# Patient Record
Sex: Female | Born: 1980
Health system: Southern US, Community
[De-identification: ages and names within clinical notes are randomized; demographics above are authoritative.]

## PROBLEM LIST (undated history)

## (undated) DIAGNOSIS — G4733 Obstructive sleep apnea (adult) (pediatric): Secondary | ICD-10-CM

## (undated) DIAGNOSIS — I10 Essential (primary) hypertension: Secondary | ICD-10-CM

## (undated) DIAGNOSIS — E669 Obesity, unspecified: Secondary | ICD-10-CM

## (undated) HISTORY — DX: Obesity, unspecified: E66.9

## (undated) HISTORY — DX: Essential (primary) hypertension: I10

## (undated) HISTORY — PX: ROUX-EN-Y PROCEDURE: SUR1287

---
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
## 2005-03-15 ENCOUNTER — Other Ambulatory Visit: Admission: RE | Admit: 2005-03-15 | Discharge: 2005-03-15 | Payer: Self-pay | Admitting: Obstetrics and Gynecology

## 2006-04-01 ENCOUNTER — Other Ambulatory Visit: Admission: RE | Admit: 2006-04-01 | Discharge: 2006-04-01 | Payer: Self-pay | Admitting: Obstetrics and Gynecology

## 2009-01-10 ENCOUNTER — Ambulatory Visit (HOSPITAL_COMMUNITY): Admission: RE | Admit: 2009-01-10 | Discharge: 2009-01-10 | Payer: Self-pay | Admitting: Surgery

## 2009-01-17 ENCOUNTER — Ambulatory Visit (HOSPITAL_COMMUNITY): Admission: RE | Admit: 2009-01-17 | Discharge: 2009-01-17 | Payer: Self-pay | Admitting: Surgery

## 2009-02-03 ENCOUNTER — Ambulatory Visit (HOSPITAL_BASED_OUTPATIENT_CLINIC_OR_DEPARTMENT_OTHER): Admission: RE | Admit: 2009-02-03 | Discharge: 2009-02-03 | Payer: Self-pay | Admitting: Surgery

## 2009-02-04 ENCOUNTER — Ambulatory Visit: Payer: Self-pay | Admitting: Internal Medicine

## 2009-02-06 ENCOUNTER — Encounter: Admission: RE | Admit: 2009-02-06 | Discharge: 2009-05-07 | Payer: Self-pay | Admitting: Surgery

## 2009-05-22 ENCOUNTER — Inpatient Hospital Stay (HOSPITAL_COMMUNITY): Admission: RE | Admit: 2009-05-22 | Discharge: 2009-05-25 | Payer: Self-pay | Admitting: Surgery

## 2009-05-23 ENCOUNTER — Ambulatory Visit: Payer: Self-pay | Admitting: Surgery

## 2009-05-23 ENCOUNTER — Encounter (INDEPENDENT_AMBULATORY_CARE_PROVIDER_SITE_OTHER): Payer: Self-pay | Admitting: Surgery

## 2009-05-23 IMAGING — CR DG UGI W/ GASTROGRAFIN
2 series · 2 of 2 positions shown · IV contrast (agent unspecified)
Comparison: None

CLINICAL DATA: Morbid obesity, post bypass.

WATER SOLUBLE UPPER GI SERIES
TECHNIQUE: Single-column upper GI series was performed using water
soluble contrast.
Fluoroscopy Time: 2.4 minutes
Contrast: 50 ml [IA].

[view not recorded (1 of 2)]
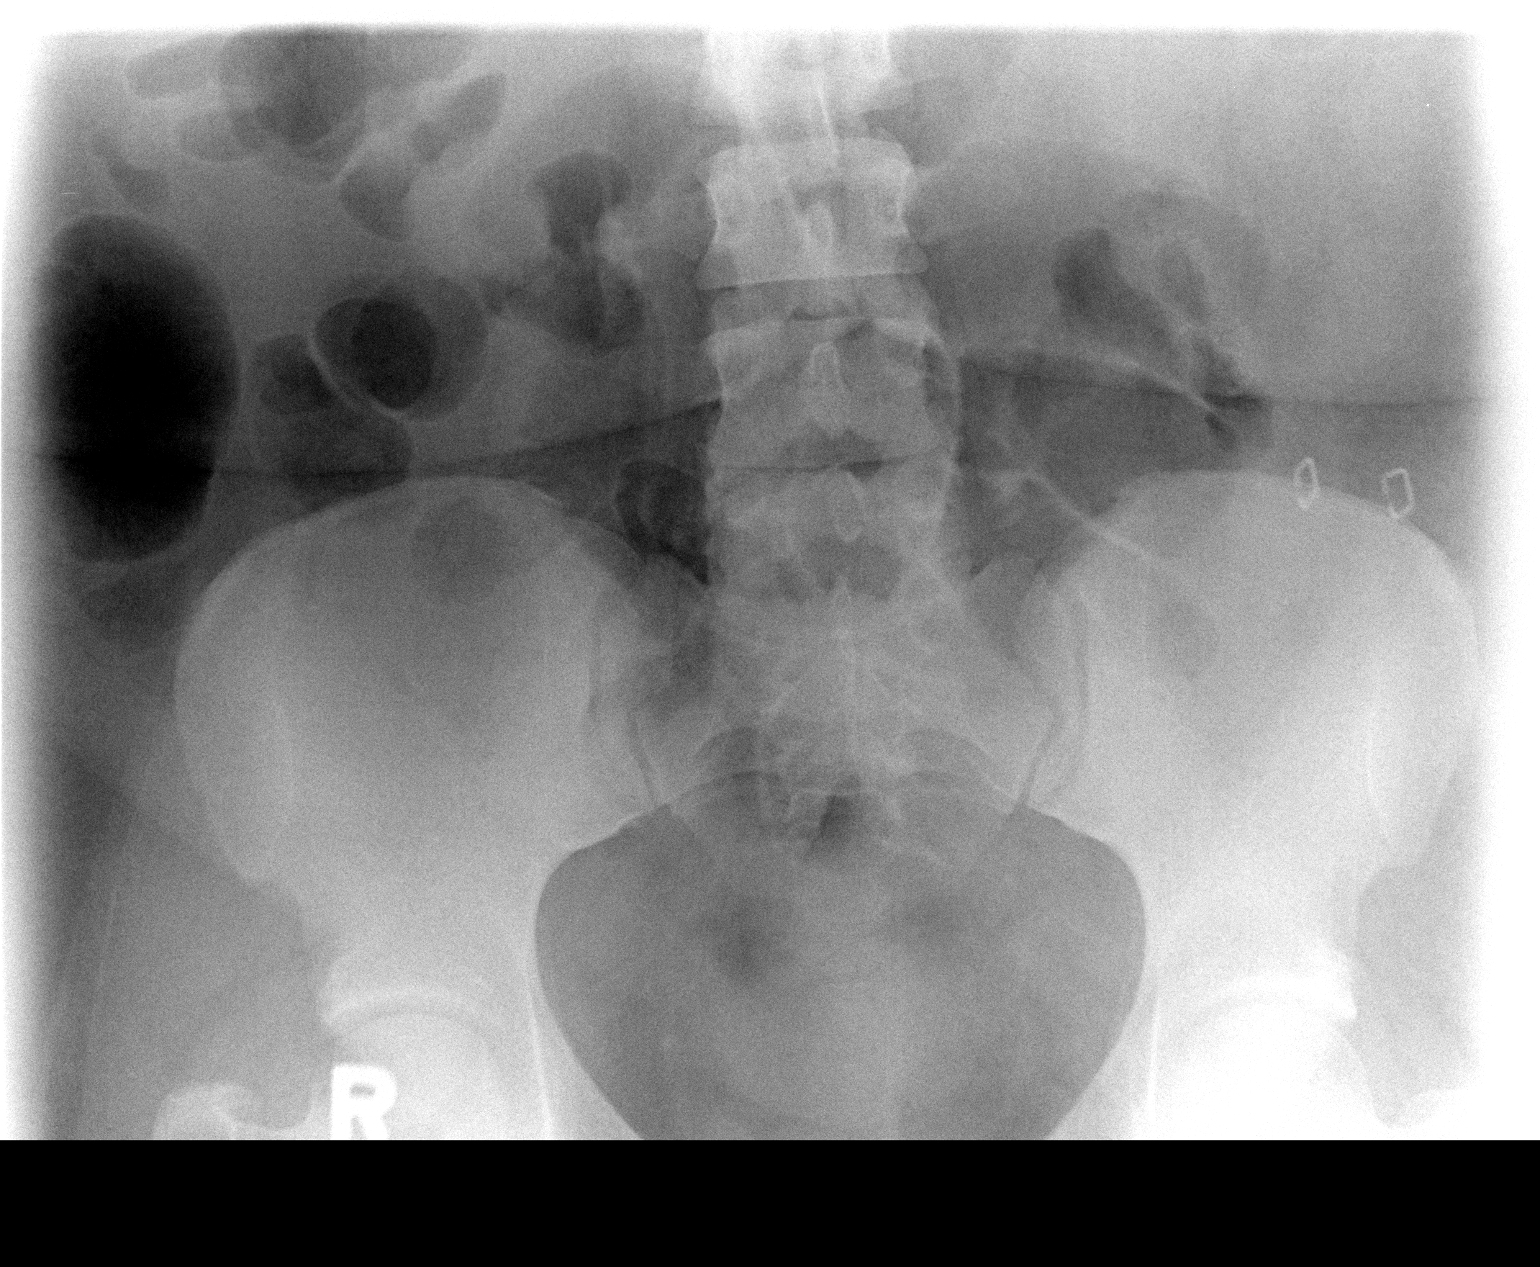

[view not recorded (2 of 2)]
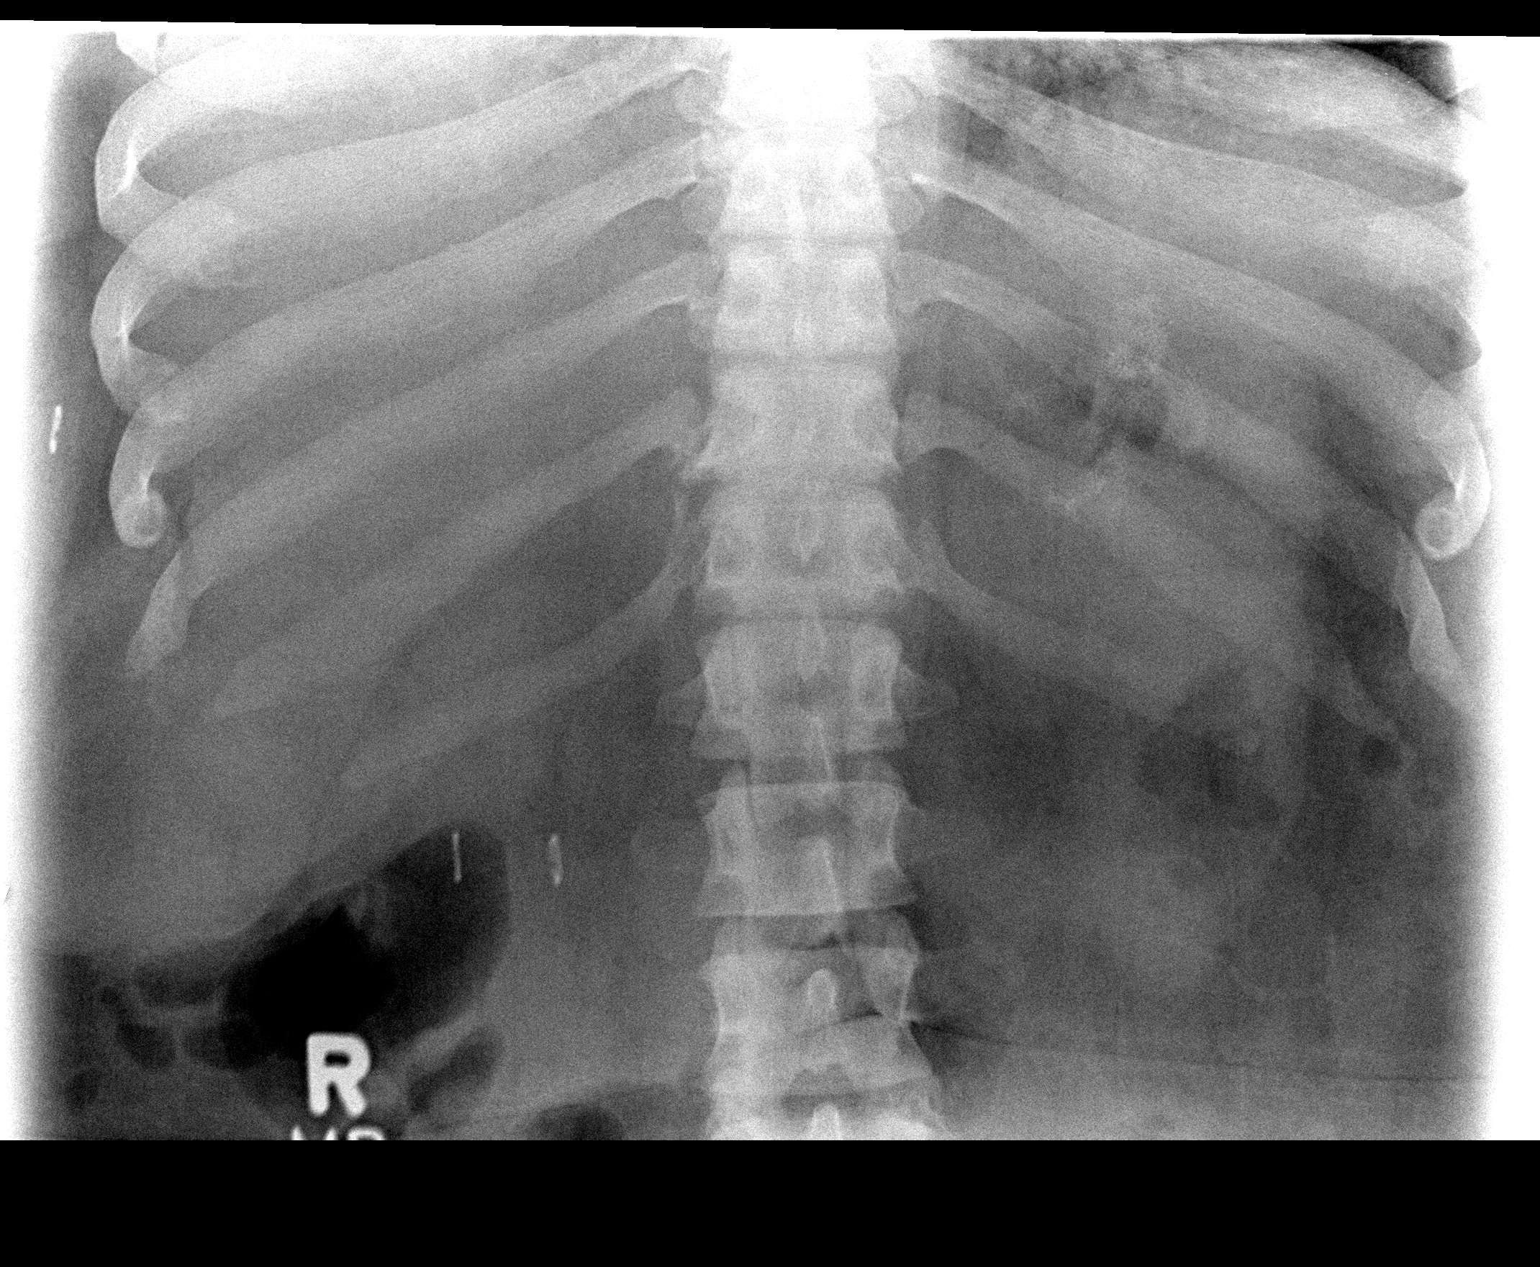

[2 of 2 positions shown; findings below may reference images not displayed]

FINDINGS: Scout film of the abdomen shows a normal bowel gas
pattern.

The patient was able to swallow a 50 ml of [IA] without
difficulty.  Severe gastroesophageal reflux noted throughout the
procedure.  There is slow emptying of the stomach.  Suspect mild
edema at the  gastric - jejunal anastomosis.  Following passage of
contrast into the jejunum, contrast sits in the proximal jejunum
for nearly an hour without passing through the distal anastomosis.
There is complete aperistalsis noted.

Given the very slow passage of contrast, the patient was sent back
upstairs and a portable KUB was done 1 hour later.  This shows
decompression of the stomach and proximal jejunum.  Distal
anastomosis still not readily apparent, but no evidence of
obstruction.
IMPRESSION: Very slow passage of contrast as above, likely related to ileus.
Suspect mild edema at the proximal anastomosis.

Free gastroesophageal reflux.

## 2009-06-05 ENCOUNTER — Encounter: Admission: RE | Admit: 2009-06-05 | Discharge: 2009-07-19 | Payer: Self-pay | Admitting: Surgery

## 2009-07-24 ENCOUNTER — Encounter: Admission: RE | Admit: 2009-07-24 | Discharge: 2009-10-22 | Payer: Self-pay | Admitting: Surgery

## 2009-08-24 ENCOUNTER — Encounter: Admission: RE | Admit: 2009-08-24 | Discharge: 2009-08-24 | Payer: Self-pay | Admitting: Surgery

## 2009-11-22 ENCOUNTER — Encounter: Admission: RE | Admit: 2009-11-22 | Discharge: 2009-11-22 | Payer: Self-pay | Admitting: Surgery

## 2010-05-30 ENCOUNTER — Encounter
Admission: RE | Admit: 2010-05-30 | Discharge: 2010-05-30 | Payer: Self-pay | Source: Home / Self Care | Attending: Surgery | Admitting: Surgery

## 2010-10-24 LAB — HEMOGLOBIN AND HEMATOCRIT, BLOOD
HCT: 35.8 % — ABNORMAL LOW (ref 36.0–46.0)
Hemoglobin: 11.8 g/dL — ABNORMAL LOW (ref 12.0–15.0)

## 2010-10-24 LAB — CBC
HCT: 34.1 % — ABNORMAL LOW (ref 36.0–46.0)
Hemoglobin: 11.3 g/dL — ABNORMAL LOW (ref 12.0–15.0)
Hemoglobin: 11.9 g/dL — ABNORMAL LOW (ref 12.0–15.0)
MCHC: 33.2 g/dL (ref 30.0–36.0)
Platelets: 250 10*3/uL (ref 150–400)
Platelets: 288 10*3/uL (ref 150–400)
RBC: 3.84 MIL/uL — ABNORMAL LOW (ref 3.87–5.11)
RDW: 14.6 % (ref 11.5–15.5)
WBC: 7.4 10*3/uL (ref 4.0–10.5)
WBC: 9 10*3/uL (ref 4.0–10.5)

## 2010-10-25 LAB — COMPREHENSIVE METABOLIC PANEL
AST: 21 U/L (ref 0–37)
Alkaline Phosphatase: 51 U/L (ref 39–117)
CO2: 32 mEq/L (ref 19–32)
Calcium: 9.9 mg/dL (ref 8.4–10.5)
GFR calc Af Amer: >60 mL/min (ref >60)
Glucose, Bld: 108 mg/dL — ABNORMAL HIGH (ref 70–99)
Sodium: 142 mEq/L (ref 135–145)
Total Protein: 9.1 g/dL — ABNORMAL HIGH (ref 6.0–8.3)

## 2010-10-25 LAB — DIFFERENTIAL
Basophils Relative: 0 % (ref 0–1)
Eosinophils Relative: 1 % (ref 0–5)
Lymphs Abs: 1.5 10*3/uL (ref 0.7–4.0)
Monocytes Relative: 11 % (ref 3–12)

## 2010-10-25 LAB — CBC
MCHC: 33.4 g/dL (ref 30.0–36.0)
MCV: 88.4 fL (ref 78.0–100.0)
RBC: 4.68 MIL/uL (ref 3.87–5.11)

## 2010-12-04 NOTE — Procedures (Signed)
NAME:  Andrea Macias, Andrea Macias            ACCOUNT NO.:  [id number]   MEDICAL RECORD NO.:  [id number]          PATIENT TYPE:  OUT   LOCATION:  SLEEP CENTER                 FACILITY:  [facility]   PHYSICIAN:  Clinton D. Maple Hudson, MD, FCCP, FACPDATE OF BIRTH:  August 20, 1979   DATE OF STUDY:  02/03/2009                            NOCTURNAL POLYSOMNOGRAM   REFERRING PHYSICIAN:   REFERRING PHYSICIAN:  Thornton Park. Daphine Deutscher, MD   INDICATIONS FOR STUDY:  Hypersomnia with sleep apnea.   EPWORTH SLEEPINESS SCORE:  Epworth sleepiness score 12/24.  BMI 64.1.  Weight 385 pounds.  Height 65 inches.  Neck 19 inches.   HOME MEDICATION:  Charted and reviewed.   SLEEP ARCHITECTURE:  Split study protocol.  During the diagnostic phase,  total sleep time 122 minutes with sleep efficiency 89.1%.  Stage I was  7.3%, stage II 92.7%, stage III absent, REM absent.  Sleep latency 8.5  minutes, awake after sleep onset 8.5 minutes, arousal index 30.4.  Diphenhydramine 50 mg taken at bedtime.   RESPIRATORY DATA:  Split study protocol.  Apnea/hypopnea index (AHI)  46.2 per hour.  A total of 129 events were scored, all as hypopneas.  Most events were while sleeping none supine.  CPAP was then titrated to  17 CWP, AHI 0 per hour.  She wore a medium full-face ResMed Quattro mask  with heated humidifier.   OXYGEN DATA:  Moderately loud snoring before CPAP with oxygen  desaturation to a nadir of 89%.  After CPAP control mean oxygen  saturation held 96.6% on room air.   CARDIAC DATA:  Normal sinus rhythm.   MOVEMENT/PARASOMNIA:  Insignificant limb movement without sleep  disturbance.  No bathroom trips.   IMPRESSION/RECOMMENDATIONS:  1. Sleep architecture was significant for absence of stages III and      REM.  This was ordered and performed as a nocturnal study.  She      normally works third shift and sleeps during the daytime.  A      daytime sleep study more consistent with the patient's sleep habit      and it can be  requested by prior, made arrangement with a sleep      disorder center when needed.  She uses diphenhydramine to assist      sleep.  2. Moderately severe obstructive sleep apnea/hypopnea syndrome, mainly      hypopneas, AHI of 46.2 per hour with most events while nonsupine.      Moderately loud snoring with oxygen desaturation to a nadir of 89%      on room air.  3. Successful continuous positive airway pressure titration to 17 CWP,      AHI 0 per hour.  She wore a medium ResMed full-face Quattro mask      with heated humidifier.      Clinton D. Maple Hudson, MD, [facility], FACP  Diplomate, Biomedical engineer of Sleep Medicine  Electronically Signed     CDY/MEDQ  D:  02/04/2009 19:45:02  T:  02/05/2009 02:15:16  Job:  [id number]

## 2011-01-28 ENCOUNTER — Encounter (INDEPENDENT_AMBULATORY_CARE_PROVIDER_SITE_OTHER): Payer: Self-pay | Admitting: Surgery

## 2011-01-28 ENCOUNTER — Ambulatory Visit (INDEPENDENT_AMBULATORY_CARE_PROVIDER_SITE_OTHER): Payer: BC Managed Care – PPO | Admitting: Surgery

## 2011-01-28 DIAGNOSIS — Z789 Other specified health status: Secondary | ICD-10-CM

## 2011-01-28 DIAGNOSIS — Z973 Presence of spectacles and contact lenses: Secondary | ICD-10-CM

## 2011-01-28 DIAGNOSIS — R634 Abnormal weight loss: Secondary | ICD-10-CM

## 2011-01-28 NOTE — Progress Notes (Signed)
Andrea Macias comes in today in followup. She is 1-1/2 years out from her Roux-en-Y gastric bypass. She's been having some abdominal pain and constipation issues. She has bowel movement every 3 days using MiraLax. She doesn't report much aware of her loss. She doesn't have any nausea or vomiting. Her weight has gone up to 30 14. When I saw her in November of 2010 her weight was 290.6.  We need to get her on the with the dietitians to see if they can week her diet and get her weight loss back on track. I reviewed her diet with her but could not determine any extra calories. She's not taking and saved in her pain is intermittent and more vague. She will see D. Clovis Riley on August hopefully he will draw her labs. I passed it she requested [place]. I'll see her again in November.

## 2011-01-29 ENCOUNTER — Other Ambulatory Visit (INDEPENDENT_AMBULATORY_CARE_PROVIDER_SITE_OTHER): Payer: Self-pay | Admitting: General Surgery

## 2011-02-22 ENCOUNTER — Encounter: Payer: Self-pay | Admitting: *Deleted

## 2011-02-22 ENCOUNTER — Encounter: Payer: BC Managed Care – PPO | Attending: Surgery | Admitting: *Deleted

## 2011-02-22 VITALS — Ht 65.0 in | Wt 316.4 lb

## 2011-02-22 DIAGNOSIS — Z713 Dietary counseling and surveillance: Secondary | ICD-10-CM | POA: Insufficient documentation

## 2011-02-22 DIAGNOSIS — Z9884 Bariatric surgery status: Secondary | ICD-10-CM | POA: Insufficient documentation

## 2011-02-22 DIAGNOSIS — Z09 Encounter for follow-up examination after completed treatment for conditions other than malignant neoplasm: Secondary | ICD-10-CM | POA: Insufficient documentation

## 2011-02-22 NOTE — Patient Instructions (Addendum)
Goals:  Follow Phase 3B: High Protein + Non-Starchy Vegetables  Eat 3-6 small meals/snacks, every 3-5 hrs  Continue lean protein foods to meet 80g goal  Increase fluid intake to 64oz +  Only consume 1/2 of carbohydrate (fruit, whole grain, starchy, starchy vegetable) with meals  Avoid drinking 15 minutes before, during and 30 minutes after eating  Continue with >30 min of physical activity daily  Decrease protein bar to a bar with <200 calories and <15g "net carb"

## 2011-02-22 NOTE — Progress Notes (Signed)
  Follow-up visit: 20 Months Post-Operative Gastric Bypass Surgery  Medical Nutrition Therapy:  Appt start time: 1000 end time:  1030.  Assessment:  Primary concerns today: post-operative bariatric surgery nutrition management. Pt reports stabbing pain in left mid-abdomen over the past 2 months. Pt has had a history of very good dietary intake and reports that her diet has not changed over the past year yet has experienced a 26# weight gain. She reports that "my food is not digesting". She notes severe constipation without regular Miralax intake. She continues to be concerned about these GI issues.  Weight today: 316.4 lbs Weight change: Gain 26.1 lbs Total weight lost:  88.2 lbs total BMI: 52.6% Weight goal: 240 lbs % Weight goal met: 53%  24-hr recall:  B (3:30 AM): 3oz baked "lean" meat with 1 cup vegetable Snk (5:45  AM): watermelon or cucumber slices OR protein bar (320 calories)  L (8:30 AM): Bacon (2 slices) OR sausage (2 patties) w/ 1 cup grits (w/ cheddar) Snk (8:30 PM): Unjury protein shake with skim milk OR protein bar (320 calories) D (10:30 PM): 3 oz "lean" meat, 1 cup of vegetable (broccoli, green beans)  Fluid intake: water, crystal light, Unjury protein, skim milk (>64oz) Estimated total protein intake: 65-75g protein  Medications: Hctz, Yasmin (BC) Supplementation: 100% compliance with ASMBS supplement regimen  Using straws: No Drinking while eating:No Hair loss: No Carbonated beverages: No N/V/D/C: Constipation, regularly (without Miralax) Dumping Syndrome: None reported  Recent physical activity:  Pt has a "home gym": stability ball, jump rope, elliptical, and cycle. She religiously exercises 5x times/week for 45 minutes  Progress Towards Goal(s):  In progress.   Nutritional Diagnosis:  [place]-3.3 Overweight/obesity As related to s/p Gastric Bypass surgery.  As evidenced by pt continuing to follow Bariatric Surgery Dietary guidelines for continued weight loss.   Intervention:    Follow Phase 3B: High Protein + Non-Starchy Vegetables  Eat 3-6 small meals/snacks, every 3-5 hrs  Continue lean protein foods to meet 80g goal  Increase fluid intake to 64oz +  Only consume 1/2 of carbohydrate (fruit, whole grain, starchy, starchy vegetable) with meals  Avoid drinking 15 minutes before, during and 30 minutes after eating  Continue with >30 min of physical activity daily  Decrease protein bar to a bar with <200 calories and <15g "net carb"  Monitoring/Evaluation:  Dietary intake, exercise, lap band fills, and body weight. Follow up in 2 months for 22 month post-op visit.

## 2011-04-25 ENCOUNTER — Encounter: Payer: Self-pay | Admitting: *Deleted

## 2011-04-25 ENCOUNTER — Encounter: Payer: BC Managed Care – PPO | Attending: Surgery | Admitting: *Deleted

## 2011-04-25 DIAGNOSIS — Z713 Dietary counseling and surveillance: Secondary | ICD-10-CM | POA: Insufficient documentation

## 2011-04-25 DIAGNOSIS — Z9884 Bariatric surgery status: Secondary | ICD-10-CM | POA: Insufficient documentation

## 2011-04-25 DIAGNOSIS — Z09 Encounter for follow-up examination after completed treatment for conditions other than malignant neoplasm: Secondary | ICD-10-CM | POA: Insufficient documentation

## 2011-04-25 NOTE — Patient Instructions (Signed)
Follow Phase 3B: High Protein + Non-Starchy Vegetables  Eat 3-6 small meals/snacks, every 3-5 hrs  Continue lean protein foods to meet 80g goal  Increase fluid intake to 64oz +  Only consume 1/2 of carbohydrate (fruit, whole grain, starchy, starchy vegetable) with meals  Avoid drinking 15 minutes before, during and 30 minutes after eating  Continue with >30 min of physical activity daily  Decrease protein bar to a bar with <200 calories and <15g "net carb"

## 2011-04-25 NOTE — Progress Notes (Signed)
  Follow-up visit: 22 Months Post-Operative Gastric Bypass Surgery  Medical Nutrition Therapy:  Appt start time: 0900 end time:  0930.  Assessment:  Primary concerns today: post-operative bariatric surgery nutrition management. Andrea Macias is very concerned that something is wrong with her pouch. She notes that the stabbing pain that she had 2 months ago has diminished yet she feels like her stomach is "twisting sometimes". She  is still concerned with the amount of weight that she is gaining despite no dietary changes. The only change Andrea Macias can attribute to her potential weight gain is the fact that she sleeps only 4-5 hours/day.  Weight today: 324.8 lbs Weight change: 8.4 lb increase Total weight lost: 79.8 lbs BMI: 54.2% Weight goal: 240 lbs  24-hr recall: Snk (10 PM): Protein shake Snk ( AM): n/a   L (12 AM): 3 oz protein (chicken or fish), 1 cup green beans D (4 AM): 3 oz baked chicken, 1 cup mixed greens  Snk (6 AM): Dannon light and fit yogurt B (9 AM): 1 pc bacon, 1/2 cup grits  Fluid intake: crystal light, water, skim milk, protein drink (unjury) = 64 oz Estimated total protein intake: 80-85g  Medications: No changes; See updated Supplementation: Taking only MVI regularly regularly  Using straws: No Drinking while eating: No Hair loss: No Carbonated beverages: No N/V/D/C: No Dumping syndrome: None reported  Recent physical activity:  Exercises at home-gym doing crunches, sit-ups, elliptical (45 minutes, 4 times/week)  Progress Towards Goal(s):  In progress.  Handouts given during visit include:  Pre-Op Diet   Nutritional Diagnosis:  [place]-3.3 Overweight/obesity As related to recent Gastric Bypass surgery.  As evidenced by pt attempting to follow post-op dietary guidelines for continued weight loss.    Intervention:  Nutrition education.  Monitoring/Evaluation:  Dietary intake, exercise, lap band fills, and body weight. Follow up in 2 months for 24 month post-op  visit.

## 2011-05-30 ENCOUNTER — Ambulatory Visit (INDEPENDENT_AMBULATORY_CARE_PROVIDER_SITE_OTHER): Payer: BC Managed Care – PPO | Admitting: Surgery

## 2011-05-30 ENCOUNTER — Encounter (INDEPENDENT_AMBULATORY_CARE_PROVIDER_SITE_OTHER): Payer: Self-pay | Admitting: Surgery

## 2011-05-30 VITALS — BP 118/82 | HR 80 | Temp 96.2°F | Resp 20 | Ht 65.0 in | Wt 314.5 lb

## 2011-05-30 DIAGNOSIS — Z9884 Bariatric surgery status: Secondary | ICD-10-CM

## 2011-05-30 NOTE — Progress Notes (Signed)
Andrea Macias 30 y.o.  Body mass index is 52.34 kg/(m^2).  Patient Active Problem List  Diagnoses  . Weight decrease  . Wears glasses  . S/P gastric bypass    No Known Allergies  Past Surgical History  Procedure Date  . Roux-en-y procedure    [name], MD No diagnosis found.  She is 24 months postop and is lost 91 pounds. Today's weight is 314.8 and dinner she had a BMI of 67 and her BMI today is down to 52. She is sort of in a plateau and I've encouraged her to continue looking at her diet and increase her exercise. She's working with a Systems analyst. I encouraged her to look at doing enemas because she still has problems with chronic constipation. She is in look at a colonic cleansing person and they begin with her.  I will see her back in 6 months in which case she'll be 2-1/2 years postop. Hopefully she will be detailed below the 300 pound marked that time. Matt B. Daphine Deutscher, MD, [facility] [phone] beeper [phone]  05/30/2011 11:51 AM

## 2011-06-06 ENCOUNTER — Encounter: Payer: Self-pay | Admitting: *Deleted

## 2011-06-06 ENCOUNTER — Encounter: Payer: BC Managed Care – PPO | Attending: Surgery | Admitting: *Deleted

## 2011-06-06 DIAGNOSIS — Z713 Dietary counseling and surveillance: Secondary | ICD-10-CM | POA: Insufficient documentation

## 2011-06-06 DIAGNOSIS — Z9884 Bariatric surgery status: Secondary | ICD-10-CM | POA: Insufficient documentation

## 2011-06-06 DIAGNOSIS — Z09 Encounter for follow-up examination after completed treatment for conditions other than malignant neoplasm: Secondary | ICD-10-CM | POA: Insufficient documentation

## 2011-06-06 NOTE — Progress Notes (Signed)
  Follow-up visit: 2 Years Post-Operative Gastric Bypass Surgery  Medical Nutrition Therapy:  Appt start time: 0831 end time:  0930.  Assessment:  Primary concerns today: post-operative bariatric surgery nutrition management.  Weight today: 317.4 lbs Weight change: 7.4 lbs Total weight lost: 87.2 lbs total BMI: 52.9 Weight goal: 240 lbs  Surgery date: 05/22/09 Start weight at [facility]: 404.6 lbs  24-hr recall:  B (9 PM): Protein shake Snk (AM): N/A   L (3 AM): 2 oz protein, 2 cup salad, 1/2 cup collards Snk (6:30 AM): Protein shake  D (9:30 AM): 2 oz protein, 2 cup salad, 1/2 cup collards Snk (2 PM): Protein shake  Fluid intake: water, protein shake = 64 oz Estimated total protein intake: 60-80g  Medications: Miralax (every other day) Supplementation: Continuing as normal, no reported problems  Using straws: No Drinking while eating: No Hair loss: No Carbonated beverages: No N/V/D/C: Constipation Dumping syndrome: No  Recent physical activity:  "Doing good" = 5 times/week, 60 minutes, Elliptical/Cycyling/Stability ball   Progress Towards Goal(s):  In progress.   Nutritional Diagnosis:  [place]-3.3 Overweight/obesity As related to recent Gastric Bypass surgery.  As evidenced by pt attempting to follow Gastric bypass guidelines for continued weight loss.    Intervention:  Nutrition education.  Monitoring/Evaluation:  Dietary intake, exercise, lap band fills, and body weight. Follow up in 3 months for  post-op visit.

## 2011-06-06 NOTE — Patient Instructions (Addendum)
Goals:  Follow Phase 3B: High Protein + Non-Starchy Vegetables  Pre-Op Diet Pattern  Eat 3-6 small meals/snacks, every 3-5 hrs  Continue lean protein foods to meet 60-80g goal  Continue fluid intake to 64oz +  Avoid drinking 15 minutes before, during and 30 minutes after eating  Aim for >30 min of physical activity daily

## 2011-07-30 ENCOUNTER — Ambulatory Visit: Payer: BC Managed Care – PPO | Admitting: *Deleted

## 2011-08-13 ENCOUNTER — Encounter: Payer: BC Managed Care – PPO | Attending: Surgery | Admitting: *Deleted

## 2011-08-13 ENCOUNTER — Encounter: Payer: Self-pay | Admitting: *Deleted

## 2011-08-13 DIAGNOSIS — Z9884 Bariatric surgery status: Secondary | ICD-10-CM | POA: Insufficient documentation

## 2011-08-13 DIAGNOSIS — Z713 Dietary counseling and surveillance: Secondary | ICD-10-CM | POA: Insufficient documentation

## 2011-08-13 DIAGNOSIS — Z09 Encounter for follow-up examination after completed treatment for conditions other than malignant neoplasm: Secondary | ICD-10-CM | POA: Insufficient documentation

## 2011-08-13 NOTE — Patient Instructions (Signed)
Goals:  Follow Phase 3B: High Protein + Non-Starchy Vegetables  Pre-Op Diet Pattern  Eat 3-6 small meals/snacks, every 3-5 hrs  Continue lean protein foods to meet 60-80g goal  Continue fluid intake to 64oz +  Avoid drinking 15 minutes before, during and 30 minutes after eating  Aim for >30 min of physical activity daily 

## 2011-08-13 NOTE — Progress Notes (Signed)
  Follow-up visit: 2 Years Post-Operative Gastric Bypass Surgery  Medical Nutrition Therapy:  Appt start time: 0830 end time:  0900.  Assessment:  Primary concerns today: post-operative bariatric surgery nutrition management. Andrea Macias requested the use of the Tanita scale to measure body composition. Will recheck her stats with Tanita at next visit.  Surgery date: 05/22/09  Start weight at [facility]: 404.6 lbs  Weight today: 320.1 lbs (318 lbs per Tanita scale) Weight change: 2.7 lbs gain Total weight lost: 84.5 lbs  BMI: 54.3% Weight goal: 240 lbs  24-hr recall:  B (9 PM): Protein shake  Snk (AM): N/A  L (3 AM): 3 oz protein, 2 cup salad (light dressing), 1/2 cup collards  Snk (6:30 AM): Protein shake  D (9:30 AM):3 oz protein, 2 cup salad (light dressing), 1/2 cup collards  Snk (2 PM): Protein shake  Fluid intake: water, protein shake = 64 oz  Estimated total protein intake: 60-80g  Medications: Miralax (every other day)  Supplementation: Continuing as normal, no reported problems  Using straws: No  Drinking while eating: No  Hair loss: No  Carbonated beverages: No  N/V/D/C: Constipation resolved  Dumping syndrome: No  Recent physical activity: "Doing good" = 5 times/week, 60 minutes, Elliptical/Cycyling/Stability ball   Progress Towards Goal(s): In progress.   Nutritional Diagnosis:  Andrea Macias-3.3 Overweight/obesity As related to recent Gastric Bypass surgery. As evidenced by pt attempting to follow Gastric bypass guidelines for continued weight loss.  Intervention: Nutrition education.   Monitoring/Evaluation: Dietary intake, exercise, and body weight. Follow up in 3 months for post-op visit.

## 2011-09-17 ENCOUNTER — Ambulatory Visit: Payer: BC Managed Care – PPO | Admitting: *Deleted

## 2011-09-20 ENCOUNTER — Encounter: Payer: Self-pay | Admitting: *Deleted

## 2011-09-20 ENCOUNTER — Encounter: Payer: BC Managed Care – PPO | Attending: Surgery | Admitting: *Deleted

## 2011-09-20 DIAGNOSIS — Z713 Dietary counseling and surveillance: Secondary | ICD-10-CM | POA: Insufficient documentation

## 2011-09-20 DIAGNOSIS — Z09 Encounter for follow-up examination after completed treatment for conditions other than malignant neoplasm: Secondary | ICD-10-CM | POA: Insufficient documentation

## 2011-09-20 DIAGNOSIS — Z9884 Bariatric surgery status: Secondary | ICD-10-CM | POA: Insufficient documentation

## 2011-09-20 NOTE — Progress Notes (Addendum)
Follow-up visit: 26 Months Post-Operative Gastric Bypass Surgery  Medical Nutrition Therapy:  Appt start time: 0830 end time:  0900.  Assessment:  Primary concerns today: post-operative bariatric surgery nutrition management.  Andrea Macias returns for follow up body composition analysis on the TANITA. Continues to struggle with weight loss.   TANITA  BODY COMP RESULTS  09/20/11 08/13/11  %Fat 53.8% 53.3%  FM (lbs) 172.0 169.5  FFM (lbs) 147.0 148.5  TBW (lbs) 108.0 108.5    Surgery date: 05/22/09  Start weight at [facility]: 404.6 lbs  Weight today: 321.9 lbs Weight change: 1.8 lb gain Total weight lost: 82.7 lbs  BMI: 53.5 Weight goal: 240 lbs  24-hr recall:  B (9:45 PM): Protein shake  Snk (10 PM): N/A  L (1 AM): 3 oz protein, 2 cup salad (light dressing), 1/2 cup greens  Snk (4:00 AM): Protein shake  D (7:30 AM): 3 oz protein, 2 cup salad (light dressing), 1/2 cup greens  Snk (10 AM): Protein shake  Fluid intake: Crystal light, water, protein shake = 64oz + Estimated total protein intake: 60-80g  Medications: Miralax (every other day)  Supplementation: Continuing as normal, no reported problems  Using straws: No  Drinking while eating: No  Hair loss: No  Carbonated beverages: No  N/V/D/C: Constipation resolved with meds Dumping syndrome: No  Recent physical activity: 4-5 times/week, 45 minutes, Elliptical/Cycling/Stability ball   Progress Towards Goal(s): In progress.   Nutritional Diagnosis:  [place]-3.3 Overweight/obesity related to recent Gastric Bypass surgery as evidenced by pt attempting to follow Gastric bypass guidelines for continued weight loss.  Intervention: Nutrition education.   Monitoring/Evaluation: Dietary intake, exercise, and body weight. Follow up in 3 months for post-op visit.

## 2011-09-21 ENCOUNTER — Encounter: Payer: Self-pay | Admitting: *Deleted

## 2011-09-21 NOTE — Patient Instructions (Signed)
Goals:  Follow Phase 3B: High Protein + Non-Starchy Vegetables  Pre-Op Diet Pattern  Eat 3-6 small meals/snacks, every 3-5 hrs  Continue lean protein foods to meet 60-80g goal  Continue fluid intake to 64oz +  Avoid drinking 15 minutes before, during and 30 minutes after eating  Aim for >30 min of physical activity daily 

## 2011-11-05 ENCOUNTER — Encounter: Payer: Self-pay | Admitting: Obstetrics and Gynecology

## 2011-11-05 ENCOUNTER — Ambulatory Visit (INDEPENDENT_AMBULATORY_CARE_PROVIDER_SITE_OTHER): Payer: BC Managed Care – PPO | Admitting: Obstetrics and Gynecology

## 2011-11-05 VITALS — BP 120/78 | Ht 65.0 in | Wt 330.0 lb

## 2011-11-05 DIAGNOSIS — R87610 Atypical squamous cells of undetermined significance on cytologic smear of cervix (ASC-US): Secondary | ICD-10-CM

## 2011-11-05 NOTE — Progress Notes (Addendum)
Last Pap 8/12 Ascus Negative Hpv Colpo 10/12 bx sig for chronic cervicitis REPEAT PAP VISIT  Subjective:    Andrea Macias is a 31 y.o. female who presents for a  repeat pap   History of previous cervical treatment:  no treatment  Last pap showed:  ASCUS with NEGATIVE high risk HPV  Date 8//2012  High Risk HPV present: no  Colposcopy results:   HPV related changes and in chronic cervicitis   Date:   10//2012  Gardasil received: no  Offered:  no Tobacco use:  No  Objective:   Pelvic:  Abdomen :  Soft obese NT External genitalia: normal Perianal skin: no external genital warts noted Vagina: normal without discharge Cervix: normal in appearance Uterus mobile nt no adnexal masses palpated  Assessment and Plan:   Abnormal pap or biopsy demonstrating ASCUS bx with chronic cervicitis  Pap # 1 performed Return to the office in 6 months for repeat Pap until 3 consecutive normal Pap Smear Continue Folic Acid 1 mg daily  DILLARD,NAIMA AMD 4/16/20139:01 AM

## 2011-11-05 NOTE — Patient Instructions (Signed)
Abnormal Pap Test WHAT DOES A PAP TEST CHECK FOR? A Pap test checks the cells in the cervix for any infections or changes that may turn into cancer. The cells are checked to see if they look normal or if they show any changes (abnormal). Abnormal changes may be a sign of problems with your cervix. Cervical cells that are abnormal are called cervical dysplasia. Dysplasia is not cancer. It is a pre-cancerous change in the cells of your cervix. WHAT DOES AN ABNORMAL PAP TEST MEAN? Most times, an abnormal Pap test does not mean you have cancer. However, it does show that there may be a problem. Your doctor will want to do other tests to find out more about the abnormal cells.  Your abnormal Pap test results could show:  Small changes that should be carefully watched.   Dysplasia that could grow into cancer.   Cancer.  When dysplasia is found and treated early, it most often does not grow into cancer. WHAT WILL BE DONE ABOUT MY ABNORMAL PAP TEST? You may have:  A colposcopy test done. Your cevix will be looked at using a strong light and a microscope.   A cone biopsy. A small, cone-shaped sample of your cervix is taken out. The part that is taken out is the area where the abnormal cells are.   Cryosurgery. The abnormal cells on your cervix will be frozen.   Loop Electrical Excision Procedure (LEEP). The abnormal cells will be taken out.  WHAT IF I HAVE A DYSPLASIA OR A CANCER? You and your doctor may choose a hysterectomy as the best treatment for you. During this surgery, your womb (uterus) and your cervix will be taken out. WHAT SHOULD YOU DO AFTER BEING TREATED? Keep having Pap tests and checkups as often as your doctor tells you. Your cervical problem will be carefully watched so it does not get worse. Also, your doctor can watch for, and treat, any new problems that may come up. Document Released: 10/23/2010 Document Revised: 06/27/2011 Document Reviewed: 06/09/2011 Exit[employer] Patient  Information 2012 [employer]. 

## 2011-11-07 LAB — PAP IG W/ RFLX HPV ASCU

## 2011-12-20 ENCOUNTER — Telehealth (INDEPENDENT_AMBULATORY_CARE_PROVIDER_SITE_OTHER): Payer: Self-pay | Admitting: General Surgery

## 2011-12-20 NOTE — Telephone Encounter (Signed)
Called patient back per message left. Patient indicated she has been set up with an appointment for 01/02/12, but wanted to know if tests could be ordered. I advised that tests would not be ordered until the day of visit. I advised that he would order tests based on evaluation and would not want to subject her to testing she may not need to determine cause of pain in her abdomen. Patient agreed.

## 2011-12-27 ENCOUNTER — Ambulatory Visit: Payer: BC Managed Care – PPO | Admitting: *Deleted

## 2012-01-02 ENCOUNTER — Encounter (INDEPENDENT_AMBULATORY_CARE_PROVIDER_SITE_OTHER): Payer: Self-pay | Admitting: Surgery

## 2012-01-02 ENCOUNTER — Ambulatory Visit (INDEPENDENT_AMBULATORY_CARE_PROVIDER_SITE_OTHER): Payer: BC Managed Care – PPO | Admitting: Surgery

## 2012-01-02 VITALS — BP 122/78 | HR 82 | Temp 96.4°F | Resp 18 | Ht 65.0 in | Wt 328.2 lb

## 2012-01-02 DIAGNOSIS — Z9884 Bariatric surgery status: Secondary | ICD-10-CM

## 2012-01-02 NOTE — Progress Notes (Signed)
Andrea Macias 31 y.o.  Body mass index is 54.62 kg/(m^2).  Patient Active Problem List  Diagnosis  . Weight decrease  . Wears glasses  . S/P gastric bypass  . ASCUS on Pap smear    Allergies  Allergen Reactions  . Penicillins Itching    Past Surgical History  Procedure Date  . Roux-en-y procedure    Andrea Stabile, MD No diagnosis found.  Frustrated by 40 lbs weight gain.  Has added some carbs which I warned her about.  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  Advised to try to lie down at least 7-8 hours a day.   Will see back in 2 months.  Today's weight is 328.4.  Andrea B. Daphine Deutscher, MD, [facility] [phone] beeper [phone]  01/02/2012 9:50 AM

## 2012-01-02 NOTE — Patient Instructions (Addendum)
Avoid carbs in diet.

## 2012-02-03 ENCOUNTER — Ambulatory Visit: Payer: BC Managed Care – PPO | Admitting: *Deleted

## 2012-02-11 ENCOUNTER — Ambulatory Visit: Payer: BC Managed Care – PPO | Admitting: *Deleted

## 2012-03-13 ENCOUNTER — Encounter: Payer: BC Managed Care – PPO | Attending: Surgery | Admitting: *Deleted

## 2012-03-13 DIAGNOSIS — Z9884 Bariatric surgery status: Secondary | ICD-10-CM | POA: Insufficient documentation

## 2012-03-13 DIAGNOSIS — Z713 Dietary counseling and surveillance: Secondary | ICD-10-CM | POA: Insufficient documentation

## 2012-03-13 DIAGNOSIS — Z09 Encounter for follow-up examination after completed treatment for conditions other than malignant neoplasm: Secondary | ICD-10-CM | POA: Insufficient documentation

## 2012-03-13 NOTE — Progress Notes (Signed)
Follow-up visit:  2 Yrs, 9 Months Post-Operative Gastric Bypass Surgery  Medical Nutrition Therapy:  Appt start time: 0830 end time:  0900.  Primary concerns today: Post-operative bariatric surgery nutrition management.  Andrea Macias returns for follow up with additional wt gain of 8 lbs.  Likely not eating enough calories for exercise level. Discussed adding more to facilitate wt loss.   TANITA  BODY COMP RESULTS  09/20/11 08/13/11 03/13/12  %Fat 53.8% 53.3% 54.0%  FM (lbs) 172.0 169.5 178.0  FFM (lbs) 147.0 148.5 152.0  TBW (lbs) 108.0 108.5 111.5   Surgery date: 05/22/09  Start weight at [facility]: 404.6 lbs  Weight today: 330.0 lbs Weight change: 8.1 lb gain Total weight lost: 74.6 lbs  BMI: 54.9 kg/m^2  Weight goal: 240 lbs % goal met: 45%  24-hr recall:  B (10 PM): Protein shake (20g) Snk (PM): N/A  L (2 AM): 3 oz protein, 2 cup salad (light dressing), 1/2 cup greens  Snk (5:00 AM): Protein shake  *WORKS OUT AT 8:30 AM*  Snk (9:45 AM): Protein shake after workout D (2:30 PM): 3 oz protein, 2 cup salad (light dressing), 1/2 cup greens   Fluid intake: Crystal light, water, protein shake = 64oz + Estimated total protein intake: 60-80g  Medications: Miralax (every other day)  Supplementation: Continuing as normal, no reported problems  Using straws: No  Drinking while eating: No  Hair loss: No  Carbonated beverages: No  N/V/D/C: Constipation resolved with meds Dumping syndrome: No  Recent physical activity: 4-5 times/week, 45-50 minutes, Elliptical/Cycling/Stability ball   Progress Towards Goal(s): In progress.   Nutritional Diagnosis:  [place]-3.3 Overweight/obesity related to recent Gastric Bypass surgery as evidenced by pt attempting to follow Gastric bypass guidelines for continued weight loss.  Intervention: Nutrition education.   Monitoring/Evaluation: Dietary intake, exercise, and body weight. Follow up in 3 months for post-op visit.

## 2012-03-13 NOTE — Patient Instructions (Addendum)
Goals:  Eat 3-6 small meals/snacks, every 3-5 hrs  Increase lean protein foods to meet 60-80g goal  Increase fluid intake to 64oz +  Add 15 grams of carbohydrate (fruit, whole grain, starchy vegetable) with meals  Avoid drinking 15 minutes before, during and 30 minutes after eating  Aim for >30 min of physical activity daily

## 2012-03-15 ENCOUNTER — Encounter: Payer: Self-pay | Admitting: *Deleted

## 2012-03-16 ENCOUNTER — Encounter: Payer: Self-pay | Admitting: *Deleted

## 2012-04-02 ENCOUNTER — Encounter (INDEPENDENT_AMBULATORY_CARE_PROVIDER_SITE_OTHER): Payer: Self-pay | Admitting: Surgery

## 2012-04-02 ENCOUNTER — Ambulatory Visit (INDEPENDENT_AMBULATORY_CARE_PROVIDER_SITE_OTHER): Payer: BC Managed Care – PPO | Admitting: Surgery

## 2012-04-02 VITALS — BP 124/84 | HR 80 | Temp 96.8°F | Resp 18 | Ht 65.0 in | Wt 337.8 lb

## 2012-04-02 DIAGNOSIS — Z9884 Bariatric surgery status: Secondary | ICD-10-CM

## 2012-04-02 NOTE — Progress Notes (Signed)
Andrea Macias 31 y.o.  Body mass index is 56.21 kg/(m^2).  Patient Active Problem List  Diagnosis  . Weight decrease  . Wears glasses  . S/P gastric bypass  . ASCUS on Pap smear    Allergies  Allergen Reactions  . Penicillins Itching    Past Surgical History  Procedure Date  . Roux-en-y procedure    [name], MD No diagnosis found.  Since she added some carbs to her diet, she has gained 10 lbs.  She is working with a Psychologist, educational.  I have given her information about the BELT program.  Have asked her to try to reduce any carbs in her diet (no toast or carrots, etc) and see me back in 3 months.     Matt B. Daphine Deutscher, MD, [facility] [phone] beeper [phone]  04/02/2012 9:30 AM

## 2012-04-02 NOTE — Patient Instructions (Signed)
Try to stay on the low carb diet Explore the BELT program to increase exercise.  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

## 2012-05-18 ENCOUNTER — Encounter: Payer: Self-pay | Admitting: Obstetrics and Gynecology

## 2012-05-18 ENCOUNTER — Ambulatory Visit (INDEPENDENT_AMBULATORY_CARE_PROVIDER_SITE_OTHER): Payer: BC Managed Care – PPO | Admitting: Obstetrics and Gynecology

## 2012-05-18 VITALS — BP 102/76 | Wt 343.0 lb

## 2012-05-18 DIAGNOSIS — R6889 Other general symptoms and signs: Secondary | ICD-10-CM

## 2012-05-18 MED ORDER — DROSPIRENONE-ETHINYL ESTRADIOL 3-0.03 MG PO TABS: 1.0000 | ORAL_TABLET | Freq: Every day | ORAL | Status: DC

## 2012-05-18 NOTE — Addendum Note (Signed)
Addended by: Rolla Plate on: 05/18/2012 01:37 PM   Modules accepted: Orders

## 2012-05-18 NOTE — Patient Instructions (Signed)
Abnormal Pap Test Information  During a Pap test, the cells on the surface of your cervix are checked to see if they look normal, abnormal, or if they show signs of having been altered by a certain type of virus called human papillomavirus, or HPV. Cervical cells that have been affected by HPV are called dysplasia. Dysplasia is not cancer, but describes abnormal cells found on the surface of the cervix. Depending on the degree of dysplasia, some of the cells may be considered pre-cancerous and may turn into cancer over time if follow up with a caregiver is delayed.   WHAT DOES AN ABNORMAL PAP TEST MEAN?  Having an abnormal pap test does not mean that you have cancer. However, certain types of abnormal pap tests can be a sign that a person is at a higher risk of developing cancer. Your caregiver will want to do other tests to find out more about the abnormal cells. Your abnormal Pap test results could show:   · Small and uncertain changes that should be carefully watched.    · Cervical dysplasia that has caused mild changes and can be followed over time.    · Cervical dysplasia that is more severe and needs to be followed and treated to ensure the problem goes away.  · Cancer.    When severe cervical dysplasia is found and treated early, it rarely will grow into cancer.   WHAT WILL BE DONE ABOUT MY ABNORMAL PAP TEST?  · A colposcopy may be needed. This is a procedure where your cervix is examined using light and magnification.  · A small tissue sample of your cervix (biopsy) may need to be removed and then examined. This is often performed if there are areas that appear infected.  · A sample of cells from the cervical canal may be removed with either a small brush or scraping instrument (curette).  Based on the results of the procedures above, some caregivers may recommend either cryotherapy of the cervix or a surgical LEEP where a portion of the cervix is removed. LEEP is short for "loop electrical excisional  procedure." Rarely, a caregiver may recommend a cone biopsy. This is a procedure where a small, cone-shaped sample of your cervix is taken out. The part that is taken out is the area where the abnormal cells are.    WHAT IF I HAVE A DYSPLASIA OR A CANCER?  You may be referred to a specialist. Radiation may also be a treatment for more advanced cancer. Having a hysterectomy is the last treatment option for dysplasia, but it is a more common treatment for someone with cancer. All treatment options will be discussed with you by your caregiver.  WHAT SHOULD YOU DO AFTER BEING TREATED?  If you have had an abnormal pap test, you should continue to have regular pap tests and check-ups as directed by your caregiver. Your cervical problem will be carefully watched so it does not get worse. Also, your caregiver can watch for, and treat, any new problems that may come up.  Document Released: 10/23/2010 Document Revised: 09/30/2011 Document Reviewed: 07/04/2011  ExitCare® Patient Information ©2013 [employer].

## 2012-05-18 NOTE — Progress Notes (Signed)
Last Pap Normal: yes Date: 11/05/11 Grade:  High Risk HPV: no Vaginal Discharge:no Prior LEEP:no Prior Conization:no Prior Cryotherapy:no Prior Lazer:no Pt with h/o ASCUS and LGSIL BP 102/76  Wt 343 lb (155.584 kg)  LMP 05/09/2012 Physical Examination: General appearance - alert, well appearing, and in no distress Abdomen - soft, nontender, nondistended, no masses or organomegaly Pelvic - normal external genitalia, vulva, vagina, cervix, uterus and adnexa #2 pap done if normal pt needs pap in one yr

## 2012-05-20 LAB — PAP IG W/ RFLX HPV ASCU

## 2012-05-27 ENCOUNTER — Telehealth (INDEPENDENT_AMBULATORY_CARE_PROVIDER_SITE_OTHER): Payer: Self-pay

## 2012-05-27 NOTE — Telephone Encounter (Signed)
Pt called re: letter for BELT program.  Pt advised per [place] Payment, letter has been signed by Dr Daphine Deutscher and faxed to [facility] last week. Hard copy sent to be scanned.

## 2012-08-04 ENCOUNTER — Other Ambulatory Visit: Payer: Self-pay | Admitting: Obstetrics and Gynecology

## 2012-08-04 DIAGNOSIS — IMO0001 Reserved for inherently not codable concepts without codable children: Secondary | ICD-10-CM

## 2012-08-04 MED ORDER — DROSPIRENONE-ETHINYL ESTRADIOL 3-0.03 MG PO TABS: 1.0000 | ORAL_TABLET | Freq: Every day | ORAL | Status: DC

## 2012-08-05 NOTE — Telephone Encounter (Signed)
Approved rx refill and sent to the pharmacy.

## 2012-08-06 ENCOUNTER — Ambulatory Visit (INDEPENDENT_AMBULATORY_CARE_PROVIDER_SITE_OTHER): Payer: BC Managed Care – PPO | Admitting: Surgery

## 2012-10-08 ENCOUNTER — Encounter (INDEPENDENT_AMBULATORY_CARE_PROVIDER_SITE_OTHER): Payer: Self-pay | Admitting: Surgery

## 2012-10-08 ENCOUNTER — Ambulatory Visit (INDEPENDENT_AMBULATORY_CARE_PROVIDER_SITE_OTHER): Payer: BC Managed Care – PPO | Admitting: Surgery

## 2012-10-08 VITALS — BP 130/82 | HR 77 | Temp 98.6°F | Resp 14 | Ht 65.0 in | Wt 333.0 lb

## 2012-10-08 DIAGNOSIS — Z9884 Bariatric surgery status: Secondary | ICD-10-CM

## 2012-10-08 NOTE — Patient Instructions (Signed)
Thanks for your patience.  If you need further assistance after leaving the office, please call our office and speak with a [facility] nurse.  [phone].  If you want to leave a message for Dr. Codi Folkerts, please call his office phone at [phone]. 

## 2012-10-08 NOTE — Progress Notes (Signed)
Andrea Macias 32 y.o.  Body mass index is 55.41 kg/(m^2).  Patient Active Problem List  Diagnosis  . Weight decrease  . Wears glasses  . Lap Roux Y Gastric Bypass Nov 2010  . ASCUS on Pap smear    Allergies  Allergen Reactions  . Penicillins Itching    Past Surgical History  Procedure Laterality Date  . Roux-en-y procedure     Andrea Stabile, MD No diagnosis found.  Andrea Macias comes in with a 52 lb weight loss.  Having sleeping issues that seem to be related to her nocturnal work and not the use of her CPAP.  She may need to make a change in her work schedule.  Otherwise she is doing everything that she can.  I can tell that she is somewhat frustrated. I will see her back in 6 months. Dr. Clovis Riley has been checking her labs and she is taking her vitamins. Recommendation to her is to come off night shift and tried to entrain her body to natural light and get better sleep Matt B. Daphine Deutscher, MD, [facility] [phone] beeper [phone]  10/08/2012 9:43 AM

## 2013-02-22 ENCOUNTER — Encounter (INDEPENDENT_AMBULATORY_CARE_PROVIDER_SITE_OTHER): Payer: Self-pay | Admitting: Surgery

## 2016-12-25 ENCOUNTER — Encounter (HOSPITAL_COMMUNITY): Payer: Self-pay

## 2018-07-22 HISTORY — PX: ENCEPHALOCELE REPAIR: SHX620

## 2018-08-31 DIAGNOSIS — Z713 Dietary counseling and surveillance: Secondary | ICD-10-CM | POA: Diagnosis not present

## 2018-09-28 DIAGNOSIS — Z713 Dietary counseling and surveillance: Secondary | ICD-10-CM | POA: Diagnosis not present

## 2018-10-05 DIAGNOSIS — Z6841 Body Mass Index (BMI) 40.0 and over, adult: Secondary | ICD-10-CM | POA: Diagnosis not present

## 2018-10-05 DIAGNOSIS — H6122 Impacted cerumen, left ear: Secondary | ICD-10-CM | POA: Diagnosis not present

## 2018-10-05 DIAGNOSIS — J3489 Other specified disorders of nose and nasal sinuses: Secondary | ICD-10-CM | POA: Diagnosis not present

## 2018-10-12 DIAGNOSIS — R0982 Postnasal drip: Secondary | ICD-10-CM | POA: Diagnosis not present

## 2018-10-12 DIAGNOSIS — J3489 Other specified disorders of nose and nasal sinuses: Secondary | ICD-10-CM | POA: Diagnosis not present

## 2018-10-12 DIAGNOSIS — Z6841 Body Mass Index (BMI) 40.0 and over, adult: Secondary | ICD-10-CM | POA: Diagnosis not present

## 2018-10-20 ENCOUNTER — Other Ambulatory Visit: Payer: Self-pay | Admitting: Otolaryngology

## 2018-10-20 ENCOUNTER — Other Ambulatory Visit (HOSPITAL_COMMUNITY): Payer: Self-pay | Admitting: Otolaryngology

## 2018-10-20 DIAGNOSIS — G9600 Cerebrospinal fluid leak, unspecified: Secondary | ICD-10-CM

## 2018-10-20 DIAGNOSIS — J3489 Other specified disorders of nose and nasal sinuses: Secondary | ICD-10-CM

## 2018-10-26 ENCOUNTER — Other Ambulatory Visit (HOSPITAL_COMMUNITY): Payer: Self-pay | Admitting: Otolaryngology

## 2018-10-26 DIAGNOSIS — J3489 Other specified disorders of nose and nasal sinuses: Secondary | ICD-10-CM

## 2018-10-26 DIAGNOSIS — G9600 Cerebrospinal fluid leak, unspecified: Secondary | ICD-10-CM

## 2018-11-18 DIAGNOSIS — M898X8 Other specified disorders of bone, other site: Secondary | ICD-10-CM | POA: Diagnosis not present

## 2018-11-18 DIAGNOSIS — J310 Chronic rhinitis: Secondary | ICD-10-CM | POA: Diagnosis not present

## 2018-11-18 DIAGNOSIS — Q019 Encephalocele, unspecified: Secondary | ICD-10-CM | POA: Diagnosis not present

## 2018-11-18 DIAGNOSIS — Z6841 Body Mass Index (BMI) 40.0 and over, adult: Secondary | ICD-10-CM | POA: Diagnosis not present

## 2018-11-26 DIAGNOSIS — M898X8 Other specified disorders of bone, other site: Secondary | ICD-10-CM | POA: Diagnosis not present

## 2018-11-26 DIAGNOSIS — G4733 Obstructive sleep apnea (adult) (pediatric): Secondary | ICD-10-CM | POA: Diagnosis not present

## 2018-11-26 DIAGNOSIS — Q019 Encephalocele, unspecified: Secondary | ICD-10-CM | POA: Diagnosis not present

## 2018-11-30 DIAGNOSIS — Z01812 Encounter for preprocedural laboratory examination: Secondary | ICD-10-CM | POA: Diagnosis not present

## 2018-11-30 DIAGNOSIS — Q019 Encephalocele, unspecified: Secondary | ICD-10-CM | POA: Diagnosis not present

## 2018-11-30 DIAGNOSIS — M898X8 Other specified disorders of bone, other site: Secondary | ICD-10-CM | POA: Diagnosis not present

## 2018-11-30 DIAGNOSIS — R220 Localized swelling, mass and lump, head: Secondary | ICD-10-CM | POA: Diagnosis not present

## 2018-11-30 DIAGNOSIS — Z1159 Encounter for screening for other viral diseases: Secondary | ICD-10-CM | POA: Diagnosis not present

## 2018-11-30 DIAGNOSIS — R569 Unspecified convulsions: Secondary | ICD-10-CM | POA: Diagnosis not present

## 2018-11-30 DIAGNOSIS — Z20828 Contact with and (suspected) exposure to other viral communicable diseases: Secondary | ICD-10-CM | POA: Diagnosis not present

## 2018-12-03 DIAGNOSIS — I160 Hypertensive urgency: Secondary | ICD-10-CM | POA: Diagnosis not present

## 2018-12-03 DIAGNOSIS — T502X5A Adverse effect of carbonic-anhydrase inhibitors, benzothiadiazides and other diuretics, initial encounter: Secondary | ICD-10-CM | POA: Diagnosis not present

## 2018-12-03 DIAGNOSIS — J310 Chronic rhinitis: Secondary | ICD-10-CM | POA: Diagnosis not present

## 2018-12-03 DIAGNOSIS — Q012 Occipital encephalocele: Secondary | ICD-10-CM | POA: Diagnosis not present

## 2018-12-03 DIAGNOSIS — E876 Hypokalemia: Secondary | ICD-10-CM | POA: Diagnosis not present

## 2018-12-03 DIAGNOSIS — M898X8 Other specified disorders of bone, other site: Secondary | ICD-10-CM | POA: Diagnosis not present

## 2018-12-03 DIAGNOSIS — Z880 Allergy status to penicillin: Secondary | ICD-10-CM | POA: Diagnosis not present

## 2018-12-03 DIAGNOSIS — Z6841 Body Mass Index (BMI) 40.0 and over, adult: Secondary | ICD-10-CM | POA: Diagnosis not present

## 2018-12-03 DIAGNOSIS — J329 Chronic sinusitis, unspecified: Secondary | ICD-10-CM | POA: Diagnosis not present

## 2018-12-03 DIAGNOSIS — R000 Tachycardia, unspecified: Secondary | ICD-10-CM | POA: Diagnosis not present

## 2018-12-03 DIAGNOSIS — G960 Cerebrospinal fluid leak: Secondary | ICD-10-CM | POA: Diagnosis not present

## 2018-12-03 DIAGNOSIS — K5900 Constipation, unspecified: Secondary | ICD-10-CM | POA: Diagnosis not present

## 2018-12-03 DIAGNOSIS — R739 Hyperglycemia, unspecified: Secondary | ICD-10-CM | POA: Diagnosis not present

## 2018-12-03 DIAGNOSIS — I10 Essential (primary) hypertension: Secondary | ICD-10-CM | POA: Diagnosis not present

## 2018-12-03 DIAGNOSIS — Z9884 Bariatric surgery status: Secondary | ICD-10-CM | POA: Diagnosis not present

## 2018-12-03 DIAGNOSIS — G4733 Obstructive sleep apnea (adult) (pediatric): Secondary | ICD-10-CM | POA: Diagnosis not present

## 2018-12-03 DIAGNOSIS — Q019 Encephalocele, unspecified: Secondary | ICD-10-CM | POA: Diagnosis not present

## 2018-12-05 DIAGNOSIS — I10 Essential (primary) hypertension: Secondary | ICD-10-CM | POA: Insufficient documentation

## 2018-12-05 DIAGNOSIS — E1159 Type 2 diabetes mellitus with other circulatory complications: Secondary | ICD-10-CM | POA: Insufficient documentation

## 2018-12-16 DIAGNOSIS — Q019 Encephalocele, unspecified: Secondary | ICD-10-CM | POA: Diagnosis not present

## 2019-01-06 DIAGNOSIS — J329 Chronic sinusitis, unspecified: Secondary | ICD-10-CM | POA: Diagnosis not present

## 2019-01-25 ENCOUNTER — Telehealth: Payer: Self-pay | Admitting: Family Medicine

## 2019-01-25 NOTE — Telephone Encounter (Signed)
See note

## 2019-01-25 NOTE — Telephone Encounter (Signed)
Patient is calling to schedule a new patient appt with Dr. Rogers Blocker. [phone]

## 2019-02-10 DIAGNOSIS — J329 Chronic sinusitis, unspecified: Secondary | ICD-10-CM | POA: Diagnosis not present

## 2019-02-17 ENCOUNTER — Encounter: Payer: Self-pay | Admitting: Family Medicine

## 2019-02-17 ENCOUNTER — Ambulatory Visit (INDEPENDENT_AMBULATORY_CARE_PROVIDER_SITE_OTHER): Payer: BC Managed Care – PPO | Admitting: Family Medicine

## 2019-02-17 VITALS — BP 136/78 | HR 98 | Ht 65.0 in | Wt >= 6400 oz

## 2019-02-17 DIAGNOSIS — G9601 Cranial cerebrospinal fluid leak, spontaneous: Secondary | ICD-10-CM

## 2019-02-17 DIAGNOSIS — Q019 Encephalocele, unspecified: Secondary | ICD-10-CM | POA: Insufficient documentation

## 2019-02-17 DIAGNOSIS — G4733 Obstructive sleep apnea (adult) (pediatric): Secondary | ICD-10-CM | POA: Insufficient documentation

## 2019-02-17 DIAGNOSIS — Z9884 Bariatric surgery status: Secondary | ICD-10-CM | POA: Diagnosis not present

## 2019-02-17 DIAGNOSIS — I10 Essential (primary) hypertension: Secondary | ICD-10-CM

## 2019-02-17 HISTORY — DX: Obstructive sleep apnea (adult) (pediatric): G47.33

## 2019-02-17 HISTORY — DX: Cranial cerebrospinal fluid leak, spontaneous: G96.01

## 2019-02-17 NOTE — Patient Instructions (Signed)
2-week virtual visit for follow-up hypertension, wean off Imdur and check daily blood pressure readings Complete physical in September with Pap smear

## 2019-02-17 NOTE — Progress Notes (Signed)
Virtual Visit via Video Note  Subjective  CC:  Chief Complaint  Patient presents with  . Establish Care    Previous PCP was 3 yrs ago, Dr. Alroy Dust at [facility]  . Hypertension    I connected with Andrea Macias on 02/17/19 at  3:00 PM EDT by a video enabled telemedicine application and verified that I am speaking with the correct person using two identifiers. Location patient: Home Location provider: [facility] [place], Office Persons participating in the virtual visit: Shritha Bresee, Leamon Arnt, MD Lilli Light, [place] discussed the limitations of evaluation and management by telemedicine and the availability of in person appointments. The patient expressed understanding and agreed to proceed. HPI: Andrea Macias is a 38 y.o. female who was contacted today to address the problems listed above in the chief complaint. . Very pleasant 38 year old single female who lives alone in [place] presents to establish care.  She was diagnosed with a CSF leak earlier this year, status post a encephalocele repair by ENT at [facility].  I reviewed that hospital note.  Apparently became hypertensive, and hypertensive crisis and was discharged on HCTZ and Imdur for blood pressure control.  She needs follow-up for her hypertension.  She reports she checks her blood pressure daily and it has been in the normal range.  No adverse effects.  She has been diagnosed with hypertension about 11 to 12 years ago but since improved with weight loss.  She denies chest pain, angina or known coronary artery disease.  She is overdue for lipid check. . Morbid obesity: Remote history of gastric bypass surgery.  BMI is greater than 70. Marland Kitchen Needs evaluation for sleep apnea.  This can be set up after she is cleared by ENT from her surgery. Marland Kitchen Health maintenance: Overdue for complete physical.  Pap smear due. Assessment  1. Essential hypertension   2. Morbid obesity ([place])   3. Lap Roux Y Gastric  Bypass Nov 2010   4. Encephalocele ([place])   5. OSA (obstructive sleep apnea)      Plan   Essential hypertension: Currently controlled but on Imdur which is an atypical antihypertensive.  Recommend weaning and rechecking via virtual visit in 2 weeks.  Adjust medications further at that time.  Morbid obesity: Needs to get back to working on weight loss.  Complete physical, patient to schedule. I discussed the assessment and treatment plan with the patient. The patient was provided an opportunity to ask questions and all were answered. The patient agreed with the plan and demonstrated an understanding of the instructions.   The patient was advised to call back or seek an in-person evaluation if the symptoms worsen or if the condition fails to improve as anticipated. Follow up: 2-week follow-up for blood pressure and another visit for complete physical at her convenience. Visit date not found  No orders of the defined types were placed in this encounter.     I reviewed the patients updated PMH, FH, and SocHx.    Patient Active Problem List   Diagnosis Date Noted  . CSF leak from nose 02/17/2019  . Encephalocele ([place]) 02/17/2019  . OSA (obstructive sleep apnea) 02/17/2019  . Essential hypertension 12/05/2018  . Morbid obesity ([place]) 11/26/2018  . Lap Roux Y Gastric Bypass Nov 2010 02/22/2011   Current Meds  Medication Sig  . hydrochlorothiazide 25 MG tablet Take 25 mg by mouth daily.    . isosorbide mononitrate (IMDUR) 60 MG 24 hr  tablet Take 60 mg by mouth daily.  . sodium chloride (ALTAMIST SPRAY) 0.65 % nasal spray Use 2 sprays in each nostril Every four (4) hours.  . [DISCONTINUED] polyethylene glycol (MIRALAX / GLYCOLAX) packet Take 17 g by mouth as needed.     Allergies: Patient is allergic to penicillins. Family History: Patient family history includes Glaucoma in her mother; Healthy in her brother, brother, sister, and sister; Hypertension in her father and mother; Lung  cancer in her father; Non-Hodgkin's lymphoma in her mother. Social History:  Patient  reports that she has never smoked. She has never used smokeless tobacco. She reports that she does not drink alcohol or use drugs.  Review of Systems: Constitutional: Negative for fever malaise or anorexia Cardiovascular: negative for chest pain Respiratory: negative for SOB or persistent cough Gastrointestinal: negative for abdominal pain  OBJECTIVE Vitals: BP 136/78   Pulse 98   Ht 5\' 5"  (1.651 m)   Wt (!) 442 lb (200.5 kg)   LMP 01/25/2019   BMI 73.55 kg/m  General: no acute distress , A&Ox3 Morbidly obese, pleasant Willow Oraamille L Shalice Woodring, MD

## 2019-03-02 DIAGNOSIS — H4711 Papilledema associated with increased intracranial pressure: Secondary | ICD-10-CM | POA: Diagnosis not present

## 2019-03-10 ENCOUNTER — Telehealth: Payer: Self-pay | Admitting: Family Medicine

## 2019-03-10 NOTE — Telephone Encounter (Signed)
Medication Refill - Medication: isosorbide mononitrate (IMDUR) 60 MG 24 hr tablet/hydrochlorothiazide 25 MG tablet/ Pt stated she spoke with Dr. Jonni Sanger about possibly changing her medications. She does not have refill on the IMDUR or HCTZ. She would like to know if Dr. Jonni Sanger is going to refill them or change to something else. Requesting callback.   Has the patient contacted their pharmacy? Yes.   (Agent: If no, request that the patient contact the pharmacy for the refill.) (Agent: If yes, when and what did the pharmacy advise?)  Preferred Pharmacy (with phone number or street name):  [id number] [address], [place] [phone] (Phone) [phone] (Fax)     Agent: Please be advised that RX refills may take up to 3 business days. We ask that you follow-up with your pharmacy.

## 2019-03-10 NOTE — Telephone Encounter (Signed)
See request °

## 2019-03-10 NOTE — Telephone Encounter (Signed)
Called pt, she is aware to take the remaining 4 tabs every other day, keep a log of BPs , and scheduled for f/u 9/9

## 2019-03-31 ENCOUNTER — Encounter: Payer: Self-pay | Admitting: Family Medicine

## 2019-03-31 ENCOUNTER — Ambulatory Visit (INDEPENDENT_AMBULATORY_CARE_PROVIDER_SITE_OTHER): Payer: BC Managed Care – PPO | Admitting: Family Medicine

## 2019-03-31 VITALS — BP 150/85 | Wt >= 6400 oz

## 2019-03-31 DIAGNOSIS — I10 Essential (primary) hypertension: Secondary | ICD-10-CM

## 2019-03-31 DIAGNOSIS — G960 Cerebrospinal fluid leak: Secondary | ICD-10-CM

## 2019-03-31 DIAGNOSIS — G9601 Cranial cerebrospinal fluid leak, spontaneous: Secondary | ICD-10-CM

## 2019-03-31 MED ORDER — HYDROCHLOROTHIAZIDE 25 MG PO TABS: 25.0000 mg | ORAL_TABLET | Freq: Every day | ORAL | 3 refills | Status: DC

## 2019-03-31 MED ORDER — DILTIAZEM HCL ER COATED BEADS 180 MG PO CP24: 180.0000 mg | ORAL_CAPSULE | Freq: Every day | ORAL | 2 refills | Status: DC

## 2019-03-31 NOTE — Progress Notes (Signed)
Virtual Visit via Video Note  Subjective  CC:  Chief Complaint  Patient presents with  . Hypertension    She reports that for the past 2 weeks she has been checking her BP daily. She ranges 150s/80s.. She denies any dizziness, HA, or blurred vision     I connected with Tonia Brooms on 03/31/19 at  9:00 AM EDT by a video enabled telemedicine application and verified that I am speaking with the correct person using two identifiers. Location patient: Home Location provider: [facility] [place] participating in the virtual visit: Tonia Brooms, Leamon Arnt, MD Lilli Light, [place] discussed the limitations of evaluation and management by telemedicine and the availability of in person appointments. The patient expressed understanding and agreed to proceed. HPI: Andrea Macias is a 38 y.o. female who was contacted today to address the problems listed above in the chief complaint/HTN f/u:  . Hypertension f/u: we stopped imdur. Pt following bps: over last two weeks since being off imdur, bp is trending upwards. Avg: 150-160s/90s. Control is poor. Pt reports she is doing well. taking medications as instructed, no medication side effects noted, no TIAs, no chest pain on exertion, no dyspnea on exertion, no swelling of ankles. She is feeling well. I have reviewed recent follow up from ENT: she is improving there as well.  She denies adverse effects from his BP medications. Compliance with medication is good.   BP Readings from Last 3 Encounters:  03/31/19 (!) 150/85  02/17/19 136/78  10/08/12 130/82   Wt Readings from Last 3 Encounters:  03/31/19 (!) 420 lb (190.5 kg)  02/17/19 (!) 442 lb (200.5 kg)  10/08/12 (!) 333 lb (151 kg)    No results found for: CHOL No results found for: HDL No results found for: LDLCALC No results found for: TRIG No results found for: CHOLHDL No results found for: LDLDIRECT Lab Results  Component Value Date    CREATININE 0.83 05/15/2009   BUN 14 05/15/2009   NA 142 05/15/2009   K 3.5 05/15/2009   CL 98 05/15/2009   CO2 32 05/15/2009    The ASCVD Risk score (Goff DC Jr., et al., 2013) failed to calculate for the following reasons:   The 2013 ASCVD risk score is only valid for ages 50 to 98  Assessment  1. Essential hypertension   2. CSF leak from nose   3. Morbid obesity ([place])      Plan   Hypertension f/u:  Add CCB and titrate up dose as needed. F/u 6 weeks for recheck and continue home monitoring. Continue hctz.   Discussed diet and weight loss: to start intermittent fasting.   Needs cpe with pap.   I discussed the assessment and treatment plan with the patient. The patient was provided an opportunity to ask questions and all were answered. The patient agreed with the plan and demonstrated an understanding of the instructions.   The patient was advised to call back or seek an in-person evaluation if the symptoms worsen or if the condition fails to improve as anticipated. Follow up: Return in about 6 weeks (around 05/12/2019) for complete physical, follow up Hypertension.  05/05/2019  Meds ordered this encounter  Medications  . diltiazem (CARDIZEM CD) 180 MG 24 hr capsule    Sig: Take 1 capsule (180 mg total) by mouth daily.    Dispense:  30 capsule    Refill:  2  . hydrochlorothiazide (HYDRODIURIL) 25 MG  tablet    Sig: Take 1 tablet (25 mg total) by mouth daily.    Dispense:  90 tablet    Refill:  3      I reviewed the patients updated PMH, FH, and SocHx.    Patient Active Problem List   Diagnosis Date Noted  . CSF leak from nose 02/17/2019  . Encephalocele ([facility]) 02/17/2019  . OSA (obstructive sleep apnea) 02/17/2019  . Essential hypertension 12/05/2018  . Morbid obesity ([facility]) 11/26/2018  . Lap Roux Y Gastric Bypass Nov 2010 02/22/2011   Current Meds  Medication Sig  . [DISCONTINUED] sodium chloride (ALTAMIST SPRAY) 0.65 % nasal spray Use 2 sprays in each nostril  Every four (4) hours.    Allergies: Patient is allergic to penicillins. Family History: Patient family history includes Glaucoma in her mother; Healthy in her brother, brother, sister, and sister; Hypertension in her father and mother; Lung cancer in her father; Non-Hodgkin's lymphoma in her mother. Social History:  Patient  reports that she has never smoked. She has never used smokeless tobacco. She reports that she does not drink alcohol or use drugs.  Review of Systems: Constitutional: Negative for fever malaise or anorexia Cardiovascular: negative for chest pain Respiratory: negative for SOB or persistent cough Gastrointestinal: negative for abdominal pain  OBJECTIVE Vitals: BP (!) 150/85   Wt (!) 420 lb (190.5 kg)   LMP 03/10/2019   BMI 69.89 kg/m  General: no acute distress , A&Ox3  Willow Oraamille L Lumir Demetriou, MD

## 2019-03-31 NOTE — Patient Instructions (Signed)
Please return in 6 weeks for your annual complete physical; please come fasting. And recheck blood pressure.   If you have any questions or concerns, please don't hesitate to send me a message via MyChart or call the office at [phone]. Thank you for visiting with [place] today! It's our pleasure caring for you.

## 2019-05-05 ENCOUNTER — Other Ambulatory Visit (HOSPITAL_COMMUNITY)
Admission: RE | Admit: 2019-05-05 | Discharge: 2019-05-05 | Disposition: A | Discharge status: Home / Self Care | Payer: BC Managed Care – PPO | Source: Ambulatory Visit | Attending: Family Medicine | Admitting: Family Medicine

## 2019-05-05 ENCOUNTER — Ambulatory Visit (INDEPENDENT_AMBULATORY_CARE_PROVIDER_SITE_OTHER): Payer: BC Managed Care – PPO | Admitting: Family Medicine

## 2019-05-05 ENCOUNTER — Encounter: Payer: Self-pay | Admitting: Family Medicine

## 2019-05-05 ENCOUNTER — Other Ambulatory Visit: Payer: Self-pay

## 2019-05-05 VITALS — BP 132/82 | HR 98 | Temp 97.3°F | Resp 18 | Ht 65.0 in | Wt >= 6400 oz

## 2019-05-05 DIAGNOSIS — I10 Essential (primary) hypertension: Secondary | ICD-10-CM | POA: Diagnosis not present

## 2019-05-05 DIAGNOSIS — Z0000 Encounter for general adult medical examination without abnormal findings: Secondary | ICD-10-CM | POA: Diagnosis not present

## 2019-05-05 DIAGNOSIS — Z124 Encounter for screening for malignant neoplasm of cervix: Secondary | ICD-10-CM | POA: Diagnosis not present

## 2019-05-05 DIAGNOSIS — R7301 Impaired fasting glucose: Secondary | ICD-10-CM | POA: Diagnosis not present

## 2019-05-05 DIAGNOSIS — Z23 Encounter for immunization: Secondary | ICD-10-CM

## 2019-05-05 DIAGNOSIS — Z9884 Bariatric surgery status: Secondary | ICD-10-CM | POA: Diagnosis not present

## 2019-05-05 LAB — CBC WITH DIFFERENTIAL/PLATELET
Basophils Absolute: 0 10*3/uL (ref 0.0–0.1)
Basophils Relative: 0.5 % (ref 0.0–3.0)
Eosinophils Absolute: 0.1 10*3/uL (ref 0.0–0.7)
Eosinophils Relative: 1 % (ref 0.0–5.0)
HCT: 38.7 % (ref 36.0–46.0)
Hemoglobin: 12.7 g/dL (ref 12.0–15.0)
Lymphocytes Relative: 29.3 % (ref 12.0–46.0)
Lymphs Abs: 1.9 10*3/uL (ref 0.7–4.0)
MCHC: 32.8 g/dL (ref 30.0–36.0)
MCV: 85.8 fl (ref 78.0–100.0)
Monocytes Absolute: 0.5 10*3/uL (ref 0.1–1.0)
Monocytes Relative: 7.2 % (ref 3.0–12.0)
Neutro Abs: 4 10*3/uL (ref 1.4–7.7)
Neutrophils Relative %: 62 % (ref 43.0–77.0)
Platelets: 320 10*3/uL (ref 150.0–400.0)
RBC: 4.51 Mil/uL (ref 3.87–5.11)
RDW: 17.2 % — ABNORMAL HIGH (ref 11.5–15.5)
WBC: 6.4 10*3/uL (ref 4.0–10.5)

## 2019-05-05 LAB — COMPREHENSIVE METABOLIC PANEL
ALT: 14 U/L (ref 0–35)
AST: 12 U/L (ref 0–37)
Albumin: 4.2 g/dL (ref 3.5–5.2)
Alkaline Phosphatase: 87 U/L (ref 39–117)
BUN: 15 mg/dL (ref 6–23)
CO2: 25 mEq/L (ref 19–32)
Calcium: 9.1 mg/dL (ref 8.4–10.5)
Chloride: 103 mEq/L (ref 96–112)
Creatinine, Ser: 0.87 mg/dL (ref 0.40–1.20)
GFR: 87.56 mL/min (ref >60.00)
Glucose, Bld: 108 mg/dL — ABNORMAL HIGH (ref 70–99)
Potassium: 3.6 mEq/L (ref 3.5–5.1)
Sodium: 141 mEq/L (ref 135–145)
Total Bilirubin: 0.3 mg/dL (ref 0.2–1.2)
Total Protein: 7.5 g/dL (ref 6.0–8.3)

## 2019-05-05 LAB — LIPID PANEL
Cholesterol: 155 mg/dL (ref 0–200)
HDL: 41.6 mg/dL (ref >39.00)
LDL Cholesterol: 92 mg/dL (ref 0–99)
NonHDL: 113.09
Total CHOL/HDL Ratio: 4
Triglycerides: 107 mg/dL (ref 0.0–149.0)
VLDL: 21.4 mg/dL (ref 0.0–40.0)

## 2019-05-05 LAB — VITAMIN D 25 HYDROXY (VIT D DEFICIENCY, FRACTURES): VITD: 8.28 ng/mL — ABNORMAL LOW (ref 30.00–100.00)

## 2019-05-05 LAB — VITAMIN B12: Vitamin B-12: 277 pg/mL (ref 211–911)

## 2019-05-05 LAB — TSH: TSH: 1.85 u[IU]/mL (ref 0.35–4.50)

## 2019-05-05 MED ORDER — DILTIAZEM HCL ER COATED BEADS 240 MG PO CP24: 240.0000 mg | ORAL_CAPSULE | Freq: Every day | ORAL | 3 refills | Status: DC

## 2019-05-05 NOTE — Progress Notes (Signed)
Subjective  Chief Complaint  Patient presents with  . Annual Exam    Fasting  . Hypertension    BPs have range low 140s/ mid 80s    HPI: Andrea Macias is a 38 y.o. female who presents to [place] [place] [facility] today for a Female Wellness Visit.  She also has the concerns and/or needs as listed above in the chief complaint. These will be addressed in addition to the Health Maintenance Visit.   Wellness Visit: annual visit with health maintenance review and exam with Pap   HM: due for pap and fasting labs. tdap and flu shot today. Having regular menses; no birth control needed at this time. Has been on pills in the past. Not currently in a sexual relationship.  Chronic disease management visit and/or acute problem visit:  HTN f/u:  Feels fine. Tolerating CCB and hctz; home readings are better but systolic still in the 26V, diastolic in the 78H. No cp or sob. Weight is stable. Trying to lose.   S/o gastric bypass: now morbidly obese again. Working on diet.   Assessment  1. Annual physical exam   2. Cervical cancer screening   3. Essential hypertension   4. Morbid obesity ([place])   5. Need for Tdap vaccination   6. Lap Roux Y Gastric Bypass Nov 2010      Plan  Female Wellness Visit:  Age appropriate Health Maintenance and Prevention measures were discussed with patient. Included topics are cancer screening recommendations, ways to keep healthy (see AVS) including dietary and exercise recommendations, regular eye and dental care, use of seat belts, and avoidance of moderate alcohol use and tobacco use. Pap and HR hpv screen today.   BMI: discussed patient's BMI and encouraged positive lifestyle modifications to help get to or maintain a target BMI.  HM needs and immunizations were addressed and ordered. See below for orders. See HM and immunization section for updates. Flu and tdap today  Routine labs and screening tests ordered including cmp, cbc and lipids  where appropriate.  Discussed recommendations regarding Vit D and calcium supplementation (see AVS)  Chronic disease f/u and/or acute problem visit: (deemed necessary to be done in addition to the wellness visit):  HTN: improved. Will increase cardizem to 240mg  and continue hctz. Should get to goal. Check renal function and electrolytes. Check lipids.   Obesity: defers referrals at this time. Screen for diabetes.   S/p gastric bypass 2010; screen for vit deficiency  Follow up: Return in about 3 months (around 08/05/2019).   Orders Placed This Encounter  Procedures  . Tdap vaccine greater than or equal to 7yo IM  . CBC with Differential/Platelet  . Comprehensive metabolic panel  . Lipid panel  . HIV Antibody (routine testing w rflx)  . TSH  . VITAMIN D 25 Hydroxy (Vit-D Deficiency, Fractures)  . Vitamin B12   Meds ordered this encounter  Medications  . diltiazem (CARDIZEM CD) 240 MG 24 hr capsule    Sig: Take 1 capsule (240 mg total) by mouth daily.    Dispense:  90 capsule    Refill:  3      Lifestyle: Body mass index is 70.52 kg/m. Wt Readings from Last 3 Encounters:  05/05/19 (!) 423 lb 12.8 oz (192.2 kg)  03/31/19 (!) 420 lb (190.5 kg)  02/17/19 (!) 442 lb (200.5 kg)     Patient Active Problem List   Diagnosis Date Noted  . CSF leak from nose 02/17/2019  S/p repair due to meningocoele. See [facility]. [facility] [id number]   . Encephalocele (HCC) 02/17/2019  . OSA (obstructive sleep apnea) 02/17/2019  . Essential hypertension 12/05/2018  . Morbid obesity (HCC) 11/26/2018  . Lap Roux Y Gastric Bypass Nov 2010 02/22/2011    Surgery date: 05/22/09    Health Maintenance  Topic Date Due  . HIV Screening  01/07/1995  . TETANUS/TDAP  01/07/1999  . PAP SMEAR-Modifier  05/19/2015  . INFLUENZA VACCINE  02/20/2019   There is no immunization history for the selected administration types on file for this patient. We updated and reviewed the patient's past history in  detail and it is documented below. Allergies: Patient  reports no history of alcohol use. Past Medical History Patient  has a past medical history of CSF leak from nose (02/17/2019), Hypertension, Obesity, and OSA (obstructive sleep apnea) (02/17/2019). Past Surgical History Patient  has a past surgical history that includes Roux-en-y procedure and Encephalocele repair (2020). Social History   Socioeconomic History  . Marital status: Single    Spouse name: never married  . Number of children: 0  . Years of education: Not on file  . Highest education level: Not on file  Occupational History  . Occupation: Engineer, maintenance: [employer]  Social Needs  . Financial resource strain: Not on file  . Food insecurity    Worry: Not on file    Inability: Not on file  . Transportation needs    Medical: Not on file    Non-medical: Not on file  Tobacco Use  . Smoking status: Never Smoker  . Smokeless tobacco: Never Used  Substance and Sexual Activity  . Alcohol use: No  . Drug use: No  . Sexual activity: Not Currently    Birth control/protection: Pill    Comment: ocella  Lifestyle  . Physical activity    Days per week: Not on file    Minutes per session: Not on file  . Stress: Not on file  Relationships  . Social Musician on phone: Not on file    Gets together: Not on file    Attends religious service: Not on file    Active member of club or organization: Not on file    Attends meetings of clubs or organizations: Not on file    Relationship status: Not on file  Other Topics Concern  . Not on file  Social History Narrative  . Not on file   Family History  Problem Relation Age of Onset  . Hypertension Mother   . Glaucoma Mother   . Non-Hodgkin's lymphoma Mother   . Hypertension Father   . Lung cancer Father   . Healthy Sister   . Healthy Brother   . Healthy Brother   . Healthy Sister     Review of Systems: Constitutional: negative for fever or  malaise Ophthalmic: negative for photophobia, double vision or loss of vision Cardiovascular: negative for chest pain, dyspnea on exertion, or new LE swelling Respiratory: negative for SOB or persistent cough Gastrointestinal: negative for abdominal pain, change in bowel habits or melena Genitourinary: negative for dysuria or gross hematuria, no abnormal uterine bleeding or disharge Musculoskeletal: negative for new gait disturbance or muscular weakness Integumentary: negative for new or persistent rashes, no breast lumps Neurological: negative for TIA or stroke symptoms Psychiatric: negative for SI or delusions Allergic/Immunologic: negative for hives  Patient Care Team    Relationship Specialty Notifications Start End  [place], [place] L,  MD PCP - General Family Medicine  02/17/19   Himmelrich, Loree FeeSara S, RD (Inactive) Dietitian Bariatrics  09/20/11   Jaymes Graffillard, Naima, MD Consulting Physician Obstetrics and Gynecology  02/17/19    BP Readings from Last 3 Encounters:  05/05/19 132/82  03/31/19 (!) 150/85  02/17/19 136/78    Objective  Vitals: BP 132/82   Pulse 98   Temp (!) 97.3 F (36.3 C) (Tympanic)   Resp 18   Ht 5\' 5"  (1.651 m)   Wt (!) 423 lb 12.8 oz (192.2 kg)   LMP 04/14/2019   SpO2 96%   BMI 70.52 kg/m  General:  Well developed, well nourished, no acute distress , obese Psych:  Alert and orientedx3,normal mood and affect HEENT:  Normocephalic, atraumatic, non-icteric sclera, PERRL, oropharynx is clear without mass or exudate, supple neck without adenopathy, mass or thyromegaly Cardiovascular:  Normal S1, S2, RRR without gallop, rub . + soft murmur, nondisplaced PMI Respiratory:  Good breath sounds bilaterally, CTAB with normal respiratory effort Gastrointestinal: normal bowel sounds, soft, non-tender, no noted masses. No HSM MSK: no deformities, contusions. Joints are without erythema or swelling. Spine and CVA region are nontender Skin:  Warm, no rashes or suspicious lesions  noted Neurologic:    Mental status is normal. CN 2-11 are normal. Gross motor and sensory exams are normal. Normal gait. No tremor Breast Exam: No mass, skin retraction or nipple discharge is appreciated in either breast. No axillary adenopathy. Fibrocystic changes are not noted Pelvic Exam: Normal external genitalia, no vulvar or vaginal lesions present. Clear cervix w/o CMT. Bimanual exam reveals a nontender fundus w/o masses but exam limited due to body habitus. No adnexal masses present. No inguinal adenopathy. A PAP smear was performed.     Commons side effects, risks, benefits, and alternatives for medications and treatment plan prescribed today were discussed, and the patient expressed understanding of the given instructions. Patient is instructed to call or message via MyChart if he/she has any questions or concerns regarding our treatment plan. No barriers to understanding were identified. We discussed Red Flag symptoms and signs in detail. Patient expressed understanding regarding what to do in case of urgent or emergency type symptoms.   Medication list was reconciled, printed and provided to the patient in AVS. Patient instructions and summary information was reviewed with the patient as documented in the AVS. This note was prepared with assistance of Dragon voice recognition software. Occasional wrong-word or sound-a-like substitutions may have occurred due to the inherent limitations of voice recognition software

## 2019-05-05 NOTE — Patient Instructions (Signed)
Please return in 3 months for follow up of your hypertension.  I've increased the dose of cardizem. Continue taking the HCTZ daily as well.  Please sign up for MyChart. I have texted you a link.  I will release your lab results to you on your MyChart account with further instructions. Please reply with any questions.   Today you were given your Tdap and flu vaccinations.   If you have any questions or concerns, please don't hesitate to send me a message via MyChart or call the office at [phone]. Thank you for visiting with [place] today! It's our pleasure caring for you.   Preventive Care 39-39 Years Old, Female Preventive care refers to visits with your health care provider and lifestyle choices that can promote health and wellness. This includes:  A yearly physical exam. This may also be called an annual well check.  Regular dental visits and eye exams.  Immunizations.  Screening for certain conditions.  Healthy lifestyle choices, such as eating a healthy diet, getting regular exercise, not using drugs or products that contain nicotine and tobacco, and limiting alcohol use. What can I expect for my preventive care visit? Physical exam Your health care provider will check your:  Height and weight. This may be used to calculate body mass index (BMI), which tells if you are at a healthy weight.  Heart rate and blood pressure.  Skin for abnormal spots. Counseling Your health care provider may ask you questions about your:  Alcohol, tobacco, and drug use.  Emotional well-being.  Home and relationship well-being.  Sexual activity.  Eating habits.  Work and work Statistician.  Method of birth control.  Menstrual cycle.  Pregnancy history. What immunizations do I need?  Influenza (flu) vaccine  This is recommended every year. Tetanus, diphtheria, and pertussis (Tdap) vaccine  You may need a Td booster every 10 years. Varicella (chickenpox) vaccine  You may need  this if you have not been vaccinated. Human papillomavirus (HPV) vaccine  If recommended by your health care provider, you may need three doses over 6 months. Measles, mumps, and rubella (MMR) vaccine  You may need at least one dose of MMR. You may also need a second dose. Meningococcal conjugate (MenACWY) vaccine  One dose is recommended if you are age 52-21 years and a first-year college student living in a residence hall, or if you have one of several medical conditions. You may also need additional booster doses. Pneumococcal conjugate (PCV13) vaccine  You may need this if you have certain conditions and were not previously vaccinated. Pneumococcal polysaccharide (PPSV23) vaccine  You may need one or two doses if you smoke cigarettes or if you have certain conditions. Hepatitis A vaccine  You may need this if you have certain conditions or if you travel or work in places where you may be exposed to hepatitis A. Hepatitis B vaccine  You may need this if you have certain conditions or if you travel or work in places where you may be exposed to hepatitis B. Haemophilus influenzae type b (Hib) vaccine  You may need this if you have certain conditions. You may receive vaccines as individual doses or as more than one vaccine together in one shot (combination vaccines). Talk with your health care provider about the risks and benefits of combination vaccines. What tests do I need?  Blood tests  Lipid and cholesterol levels. These may be checked every 5 years starting at age 50.  Hepatitis C test.  Hepatitis B test.  Screening  Diabetes screening. This is done by checking your blood sugar (glucose) after you have not eaten for a while (fasting).  Sexually transmitted disease (STD) testing.  BRCA-related cancer screening. This may be done if you have a family history of breast, ovarian, tubal, or peritoneal cancers.  Pelvic exam and Pap test. This may be done every 3 years  starting at age 76. Starting at age 27, this may be done every 5 years if you have a Pap test in combination with an HPV test. Talk with your health care provider about your test results, treatment options, and if necessary, the need for more tests. Follow these instructions at home: Eating and drinking   Eat a diet that includes fresh fruits and vegetables, whole grains, lean protein, and low-fat dairy.  Take vitamin and mineral supplements as recommended by your health care provider.  Do not drink alcohol if: ? Your health care provider tells you not to drink. ? You are pregnant, may be pregnant, or are planning to become pregnant.  If you drink alcohol: ? Limit how much you have to 0-1 drink a day. ? Be aware of how much alcohol is in your drink. In the [place] one drink equals one 12 oz bottle of beer (355 mL), one 5 oz glass of wine (148 mL), or one 1 oz glass of hard liquor (44 mL). Lifestyle  Take daily care of your teeth and gums.  Stay active. Exercise for at least 30 minutes on 5 or more days each week.  Do not use any products that contain nicotine or tobacco, such as cigarettes, e-cigarettes, and chewing tobacco. If you need help quitting, ask your health care provider.  If you are sexually active, practice safe sex. Use a condom or other form of birth control (contraception) in order to prevent pregnancy and STIs (sexually transmitted infections). If you plan to become pregnant, see your health care provider for a preconception visit. What's next?  Visit your health care provider once a year for a well check visit.  Ask your health care provider how often you should have your eyes and teeth checked.  Stay up to date on all vaccines. This information is not intended to replace advice given to you by your health care provider. Make sure you discuss any questions you have with your health care provider. Document Released: 09/03/2001 Document Revised: 03/19/2018 Document  Reviewed: 03/19/2018 Elsevier Patient Education  2020 [place] and Cholesterol Restricted Eating Plan Getting too much fat and cholesterol in your diet may cause health problems. Choosing the right foods helps keep your fat and cholesterol at normal levels. This can keep you from getting certain diseases.  What are tips for following this plan? Meal planning  At meals, divide your plate into four equal parts: ? Fill one-half of your plate with vegetables and green salads. ? Fill one-fourth of your plate with whole grains. ? Fill one-fourth of your plate with low-fat (lean) protein foods.  Eat fish that is high in omega-3 fats at least two times a week. This includes mackerel, tuna, sardines, and salmon.  Eat foods that are high in fiber, such as whole grains, beans, apples, broccoli, carrots, peas, and barley. General tips   Work with your doctor to lose weight if you need to.  Avoid: ? Foods with added sugar. ? Fried foods. ? Foods with partially hydrogenated oils.  Limit alcohol intake to no more than 1 drink a day for nonpregnant  women and 2 drinks a day for men. One drink equals 12 oz of beer, 5 oz of wine, or 1 oz of hard liquor. Reading food labels  Check food labels for: ? Trans fats. ? Partially hydrogenated oils. ? Saturated fat (g) in each serving. ? Cholesterol (mg) in each serving. ? Fiber (g) in each serving.  Choose foods with healthy fats, such as: ? Monounsaturated fats. ? Polyunsaturated fats. ? Omega-3 fats.  Choose grain products that have whole grains. Look for the word "whole" as the first word in the ingredient list. Cooking  Cook foods using low-fat methods. These include baking, boiling, grilling, and broiling.  Eat more home-cooked foods. Eat at restaurants and buffets less often.  Avoid cooking using saturated fats, such as butter, cream, palm oil, palm kernel oil, and coconut oil. Recommended foods  Fruits  All fresh,  canned (in natural juice), or frozen fruits. Vegetables  Fresh or frozen vegetables (raw, steamed, roasted, or grilled). Green salads. Grains  Whole grains, such as whole wheat or whole grain breads, crackers, cereals, and pasta. Unsweetened oatmeal, bulgur, barley, quinoa, or brown rice. Corn or whole wheat flour tortillas. Meats and other protein foods  Ground beef (85% or leaner), grass-fed beef, or beef trimmed of fat. Skinless chicken or [place]. Ground chicken or [place]. Pork trimmed of fat. All fish and seafood. Egg whites. Dried beans, peas, or lentils. Unsalted nuts or seeds. Unsalted canned beans. Nut butters without added sugar or oil. Dairy  Low-fat or nonfat dairy products, such as skim or 1% milk, 2% or reduced-fat cheeses, low-fat and fat-free ricotta or cottage cheese, or plain low-fat and nonfat yogurt. Fats and oils  Tub margarine without trans fats. Light or reduced-fat mayonnaise and salad dressings. Avocado. Olive, canola, sesame, or safflower oils. The items listed above may not be a complete list of foods and beverages you can eat. Contact a dietitian for more information. Foods to avoid Fruits  Canned fruit in heavy syrup. Fruit in cream or butter sauce. Fried fruit. Vegetables  Vegetables cooked in cheese, cream, or butter sauce. Fried vegetables. Grains  White bread. White pasta. White rice. Cornbread. Bagels, pastries, and croissants. Crackers and snack foods that contain trans fat and hydrogenated oils. Meats and other protein foods  Fatty cuts of meat. Ribs, chicken wings, bacon, sausage, bologna, salami, chitterlings, fatback, hot dogs, bratwurst, and packaged lunch meats. Liver and organ meats. Whole eggs and egg yolks. Chicken and [place] with skin. Fried meat. Dairy  Whole or 2% milk, cream, half-and-half, and cream cheese. Whole milk cheeses. Whole-fat or sweetened yogurt. Full-fat cheeses. Nondairy creamers and whipped toppings. Processed cheese,  cheese spreads, and cheese curds. Beverages  Alcohol. Sugar-sweetened drinks such as sodas, lemonade, and fruit drinks. Fats and oils  Butter, stick margarine, lard, shortening, ghee, or bacon fat. Coconut, palm kernel, and palm oils. Sweets and desserts  Corn syrup, sugars, honey, and molasses. Candy. Jam and jelly. Syrup. Sweetened cereals. Cookies, pies, cakes, donuts, muffins, and ice cream. The items listed above may not be a complete list of foods and beverages you should avoid. Contact a dietitian for more information. Summary  Choosing the right foods helps keep your fat and cholesterol at normal levels. This can keep you from getting certain diseases.  At meals, fill one-half of your plate with vegetables and green salads.  Eat high-fiber foods, like whole grains, beans, apples, carrots, peas, and barley.  Limit added sugar, saturated fats, alcohol, and fried foods. This information is  not intended to replace advice given to you by your health care provider. Make sure you discuss any questions you have with your health care provider. Document Released: 01/07/2012 Document Revised: 03/11/2018 Document Reviewed: 03/25/2017 Elsevier Patient Education  2020 [employer].

## 2019-05-06 LAB — HIV ANTIBODY (ROUTINE TESTING W REFLEX): HIV 1&2 Ab, 4th Generation: NONREACTIVE

## 2019-05-07 ENCOUNTER — Other Ambulatory Visit: Payer: Self-pay

## 2019-05-07 ENCOUNTER — Other Ambulatory Visit: Payer: Self-pay | Admitting: *Deleted

## 2019-05-07 DIAGNOSIS — R432 Parageusia: Secondary | ICD-10-CM

## 2019-05-07 DIAGNOSIS — R430 Anosmia: Secondary | ICD-10-CM

## 2019-05-07 DIAGNOSIS — Z20828 Contact with and (suspected) exposure to other viral communicable diseases: Secondary | ICD-10-CM | POA: Diagnosis not present

## 2019-05-07 DIAGNOSIS — Z20822 Contact with and (suspected) exposure to covid-19: Secondary | ICD-10-CM

## 2019-05-07 LAB — HEMOGLOBIN A1C: Hgb A1c MFr Bld: 6.8 % — ABNORMAL HIGH (ref 4.6–6.5)

## 2019-05-07 MED ORDER — VITAMIN D (ERGOCALCIFEROL) 1.25 MG (50000 UNIT) PO CAPS: 50000.0000 [IU] | ORAL_CAPSULE | ORAL | 0 refills | Status: DC

## 2019-05-07 NOTE — Addendum Note (Signed)
Addended by: Billey Chang on: 05/07/2019 10:27 AM   Modules accepted: Orders

## 2019-05-07 NOTE — Progress Notes (Signed)
Please call patient: I have reviewed his/her lab results. Labs look mostly good. Fasting sugar is elevated in the prediabetic range: working on diet by limiting sweets and carbs will be important to prevent developing diabetes.  Also, vitamin D levels are very low.  I have ordered a supplement to take weekly for the next 12 weeks.  In addition, please take over-the-counter vitamin D 4000 units daily.  We will recheck at her next visit in 3 months.  Andrea Macias, please add on A1c for diagnosis hyperglycemia

## 2019-05-07 NOTE — Addendum Note (Signed)
Addended by: Francis Dowse T on: 05/07/2019 12:22 PM   Modules accepted: Orders

## 2019-05-09 LAB — NOVEL CORONAVIRUS, NAA: SARS-CoV-2, NAA: NOT DETECTED

## 2019-05-11 DIAGNOSIS — H4711 Papilledema associated with increased intracranial pressure: Secondary | ICD-10-CM | POA: Diagnosis not present

## 2019-05-11 LAB — CYTOLOGY - PAP
Comment: NEGATIVE
Diagnosis: NEGATIVE
High risk HPV: NEGATIVE

## 2019-05-19 DIAGNOSIS — M898X8 Other specified disorders of bone, other site: Secondary | ICD-10-CM | POA: Diagnosis not present

## 2019-05-19 DIAGNOSIS — J310 Chronic rhinitis: Secondary | ICD-10-CM | POA: Diagnosis not present

## 2019-05-19 DIAGNOSIS — Z6841 Body Mass Index (BMI) 40.0 and over, adult: Secondary | ICD-10-CM | POA: Diagnosis not present

## 2019-05-19 DIAGNOSIS — Q019 Encephalocele, unspecified: Secondary | ICD-10-CM | POA: Diagnosis not present

## 2019-05-25 ENCOUNTER — Other Ambulatory Visit: Payer: Self-pay | Admitting: Family Medicine

## 2019-07-18 ENCOUNTER — Other Ambulatory Visit: Payer: Self-pay

## 2019-07-18 ENCOUNTER — Ambulatory Visit
Admission: EM | Admit: 2019-07-18 | Discharge: 2019-07-18 | Disposition: A | Discharge status: Home / Self Care | Payer: BC Managed Care – PPO | Attending: Emergency Medicine | Admitting: Emergency Medicine

## 2019-07-18 ENCOUNTER — Telehealth: Payer: BC Managed Care – PPO

## 2019-07-18 DIAGNOSIS — R0902 Hypoxemia: Secondary | ICD-10-CM

## 2019-07-18 DIAGNOSIS — R05 Cough: Secondary | ICD-10-CM

## 2019-07-18 DIAGNOSIS — R059 Cough, unspecified: Secondary | ICD-10-CM

## 2019-07-18 DIAGNOSIS — Z20828 Contact with and (suspected) exposure to other viral communicable diseases: Secondary | ICD-10-CM | POA: Diagnosis not present

## 2019-07-18 LAB — POC SARS CORONAVIRUS 2 AG -  ED: SARS Coronavirus 2 Ag: NEGATIVE

## 2019-07-18 MED ORDER — AZITHROMYCIN 250 MG PO TABS: 250.0000 mg | ORAL_TABLET | Freq: Every day | ORAL | 0 refills | Status: DC

## 2019-07-18 MED ORDER — ALBUTEROL SULFATE HFA 108 (90 BASE) MCG/ACT IN AERS: 2.0000 | INHALATION_SPRAY | RESPIRATORY_TRACT | 0 refills | Status: DC | PRN

## 2019-07-18 NOTE — ED Provider Notes (Signed)
[facility]    CSN: [id number] Arrival date & time: 07/18/19  1430      History   Chief Complaint Chief Complaint  Patient presents with  . cough, congestion    HPI Lanora Reveron is a 38 y.o. female with history of morbid obesity, hypertension, OSA presenting for 5-day course of nasal and chest congestion, cough, fatigue.  Patient worsening sputum production "sometimes"; will range from light yellow to clear.  Denies hemoptysis, wheezing, shortness of breath, chest pain.  Patient denies known sick contacts.  Has tried Mucinex without significant relief.  Patient routinely followed by PCP: States that she has a sleep study pending so that she can get a CPAP.  Not currently using oxygen at home.  Patient denies history of coagulopathy, blood clot, prolonged stasis, travel, injury or surgery.  Denies tobacco abuse, illicit drug use, hormone therapy.   Past Medical History:  Diagnosis Date  . CSF leak from nose 02/17/2019   S/p repair due to meningocoele. [facility]. Resolve [id number]  . Hypertension   . Obesity   . OSA (obstructive sleep apnea) 02/17/2019    Patient Active Problem List   Diagnosis Date Noted  . CSF leak from nose 02/17/2019  . Encephalocele ([place]) 02/17/2019  . OSA (obstructive sleep apnea) 02/17/2019  . Essential hypertension 12/05/2018  . Morbid obesity ([place]) 11/26/2018  . Lap Roux Y Gastric Bypass Nov 2010 02/22/2011    Past Surgical History:  Procedure Laterality Date  . ENCEPHALOCELE REPAIR  2020   csf leak, ent at [facility] see records  . ROUX-EN-Y PROCEDURE      OB History    Gravida  0   Para  0   Term      Preterm      AB      Living  0     SAB      TAB      Ectopic      Multiple      Live Births               Home Medications    Prior to Admission medications   Medication Sig Start Date End Date Taking? Authorizing Provider  albuterol (VENTOLIN HFA) 108 (90 Base) MCG/ACT inhaler Inhale 2 puffs into the  lungs every 4 (four) hours as needed for wheezing or shortness of breath. 07/18/19   Hall-Potvin, [place], PA-C  azithromycin (ZITHROMAX Z-PAK) 250 MG tablet Take by mouth daily.    [provider]  benzonatate (TESSALON) 200 MG capsule Take 1 capsule (200 mg total) by mouth 2 (two) times daily as needed for cough. 07/19/19   Vivi Barrack, MD  diltiazem (CARDIZEM CD) 240 MG 24 hr capsule Take 1 capsule (240 mg total) by mouth daily. 05/05/19   Leamon Arnt, MD  hydrochlorothiazide (HYDRODIURIL) 25 MG tablet Take 1 tablet (25 mg total) by mouth daily. 03/31/19   Leamon Arnt, MD  Vitamin D, Ergocalciferol, (DRISDOL) 1.25 MG (50000 UT) CAPS capsule Take 1 capsule (50,000 Units total) by mouth once a week. 05/07/19 08/05/19  Leamon Arnt, MD    Family History Family History  Problem Relation Age of Onset  . Hypertension Mother   . Glaucoma Mother   . Non-Hodgkin's lymphoma Mother   . Hypertension Father   . Lung cancer Father   . Healthy Sister   . Healthy Brother   . Healthy Brother   . Healthy Sister     Social History Social  History   Tobacco Use  . Smoking status: Never Smoker  . Smokeless tobacco: Never Used  Substance Use Topics  . Alcohol use: No  . Drug use: No     Allergies   Penicillins   Review of Systems Review of Systems  Constitutional: Positive for fatigue. Negative for activity change, appetite change, chills, diaphoresis and fever.  HENT: Positive for congestion. Negative for ear pain, sinus pain, sore throat and voice change.   Eyes: Negative for pain, redness and visual disturbance.  Respiratory: Positive for cough. Negative for choking, chest tightness, shortness of breath, wheezing and stridor.   Cardiovascular: Negative for chest pain and palpitations.  Gastrointestinal: Negative for abdominal pain, diarrhea and vomiting.  Musculoskeletal: Negative for arthralgias and myalgias.  Skin: Negative for rash and wound.  Neurological:  Negative for syncope and headaches.     Physical Exam Triage Vital Signs ED Triage Vitals  Enc Vitals Group     BP 07/18/19 1456 (!) 147/91     Pulse Rate 07/18/19 1456 98     Resp 07/18/19 1456 16     Temp 07/18/19 1456 98.2 F (36.8 C)     Temp Source 07/18/19 1456 Oral     SpO2 07/18/19 1456 (!) 88 %     Weight --      Height --      Head Circumference --      Peak Flow --      Pain Score 07/18/19 1501 0     Pain Loc --      Pain Edu? --      Excl. in GC? --    No data found.  Updated Vital Signs BP 138/89 (BP Location: Right Wrist)   Pulse 97   Temp 98.2 F (36.8 C) (Oral)   Resp 16   SpO2 94%   Visual Acuity Right Eye Distance:   Left Eye Distance:   Bilateral Distance:    Right Eye Near:   Left Eye Near:    Bilateral Near:     Physical Exam Constitutional:      General: She is not in acute distress.    Appearance: She is obese. She is not ill-appearing or diaphoretic.  HENT:     Head: Normocephalic and atraumatic.     Mouth/Throat:     Mouth: Mucous membranes are moist.     Pharynx: Oropharynx is clear. No oropharyngeal exudate or posterior oropharyngeal erythema.  Eyes:     General: No scleral icterus.    Conjunctiva/sclera: Conjunctivae normal.     Pupils: Pupils are equal, round, and reactive to light.  Neck:     Comments: Trachea midline, negative JVD Cardiovascular:     Rate and Rhythm: Normal rate and regular rhythm.     Heart sounds: No murmur. No gallop.   Pulmonary:     Effort: Pulmonary effort is normal. No respiratory distress.     Breath sounds: No wheezing or rales.     Comments: Exam limited second to body habitus Musculoskeletal:        General: Normal range of motion.     Cervical back: Normal range of motion and neck supple. No tenderness.     Right lower leg: No edema.     Left lower leg: No edema.  Lymphadenopathy:     Cervical: No cervical adenopathy.  Skin:    Capillary Refill: Capillary refill takes less than 2  seconds.     Coloration: Skin is not jaundiced or pale.  Neurological:     General: No focal deficit present.     Mental Status: She is alert and oriented to person, place, and time.  Psychiatric:        Mood and Affect: Mood normal.        Thought Content: Thought content normal.      UC Treatments / Results  Labs (all labs ordered are listed, but only abnormal results are displayed) Labs Reviewed  NOVEL CORONAVIRUS, NAA - Abnormal; Notable for the following components:      Result Value   SARS-CoV-2, NAA Detected (*)    All other components within normal limits   Narrative:    Performed at:  [address] - [facility] [address], [place], [place]  [id number] Lab Director: Jolene SchimkeSanjai Nagendra MD, Phone:  [phone]  POC SARS CORONAVIRUS 2 AG -  ED    EKG   Radiology No results found.  Procedures Procedures (including critical care time)  Medications Ordered in UC Medications - No data to display  Initial Impression / Assessment and Plan / UC Course  I have reviewed the triage vital signs and the nursing notes.  Pertinent labs & imaging results that were available during my care of the patient were reviewed by me and considered in my medical decision making (see chart for details).     Patient afebrile, nontoxic.  Patient initially presented with SPO2 88% on room air: Throughout course in urgent care desatted to 86% when seated: Placed on 4L O2 via [place] with saturations up to 97%.  Given patient's comorbidities, hypoxia rapid Covid in office, reviewed by me: Negative-PCR pending.  Discussed limited utility of checks x-ray given patient's habitus and need to go to ER for further evaluation/management of hypoxia particularly given need for supplemental oxygen in office as CT scan would provide more yield for her.  Patient feels at her baseline in terms of breathing, states she was seeking something for chest congestion.  Will start azithromycin, albuterol inhaler for cough,  bronchodilation.  Patient's hypoxia is likely chronic given and multifactorial (obesity hypoventilation syndrome, OSA).  Patient to follow closely with PCP regarding sleep study, CPAP.  This provider removed nasal cannula, observe patient for 10 minutes.  Patient discharged with SpO2 88% on room air.  Strict ER return precautions discussed, patient verbalized understanding and is agreeable to plan. Final Clinical Impressions(s) / UC Diagnoses   Final diagnoses:  Cough  Hypoxia     Discharge Instructions     Take antibiotic as prescribed. Use inhaler as needed for any chest tightness, difficulty breathing.  Very important to follow-up with PCP tomorrow morning via phone, possible in person appointment for continued evaluation of sleep apnea, sleep study, possible CPAP. Please go to the ER for any chest tightness, difficulty breathing, wheezing, chest pain, lightheadedness, weakness, fatigue.    ED Prescriptions    Medication Sig Dispense Auth. Provider   azithromycin (ZITHROMAX) 250 MG tablet Take 1 tablet (250 mg total) by mouth daily. Take first 2 tablets together, then 1 every day until finished. 6 tablet Hall-Potvin, [place], PA-C   albuterol (VENTOLIN HFA) 108 (90 Base) MCG/ACT inhaler Inhale 2 puffs into the lungs every 4 (four) hours as needed for wheezing or shortness of breath. 18 g Hall-Potvin, [place], PA-C     PDMP not reviewed this encounter.   Odette FractionHall-Potvin, [place], [place]-C 07/21/19 2127

## 2019-07-18 NOTE — Discharge Instructions (Addendum)
Take antibiotic as prescribed. Use inhaler as needed for any chest tightness, difficulty breathing.  Very important to follow-up with PCP tomorrow morning via phone, possible in person appointment for continued evaluation of sleep apnea, sleep study, possible CPAP. Please go to the ER for any chest tightness, difficulty breathing, wheezing, chest pain, lightheadedness, weakness, fatigue.

## 2019-07-18 NOTE — ED Triage Notes (Signed)
Pt presents to UC w/ c/o nasal and chest congestion, cough, fatigue x5 days ago.

## 2019-07-19 ENCOUNTER — Telehealth: Payer: Self-pay | Admitting: Nurse Practitioner

## 2019-07-19 ENCOUNTER — Telehealth: Payer: Self-pay | Admitting: Emergency Medicine

## 2019-07-19 ENCOUNTER — Ambulatory Visit (INDEPENDENT_AMBULATORY_CARE_PROVIDER_SITE_OTHER): Payer: BC Managed Care – PPO | Admitting: Family Medicine

## 2019-07-19 DIAGNOSIS — U071 COVID-19: Secondary | ICD-10-CM

## 2019-07-19 DIAGNOSIS — R05 Cough: Secondary | ICD-10-CM

## 2019-07-19 DIAGNOSIS — R059 Cough, unspecified: Secondary | ICD-10-CM

## 2019-07-19 LAB — NOVEL CORONAVIRUS, NAA: SARS-CoV-2, NAA: DETECTED — AB

## 2019-07-19 MED ORDER — BENZONATATE 200 MG PO CAPS: 200.0000 mg | ORAL_CAPSULE | Freq: Two times a day (BID) | ORAL | 0 refills | Status: DC | PRN

## 2019-07-19 NOTE — Telephone Encounter (Signed)
Spoke with patient regarding positive Covid PCR.  Patient states she was already aware: Had virtual visit with [place] for PCP who encouraged continuation of albuterol, azithromycin and added benzonatate for cough.  Patient maintains that she is doing well.  Feels chest congestion has improved with first dose of azithromycin.  Patient continuing to deny lightheadedness, headache, chest discomfort, shortness of breath.  Reviewed that patient's hypoxia in office yesterday is concerning in setting of Covid.  Patient verbalized understanding, though declining to go to ER as she "does not feel it is necessary ".  Strict ER return precautions discussed, patient verbalized understanding and is agreeable to plan.  This provider contacted Covid infusion center to see if patient qualifies for outpatient Mab therapy.

## 2019-07-19 NOTE — Telephone Encounter (Signed)
Called to Discuss with patient about Covid symptoms and the use of bamlanivimab, a monoclonal antibody infusion for those with mild to moderate Covid symptoms and at a high risk of hospitalization.     Pt is qualified for this infusion at the [facility] center due to co-morbid conditions and/or a member of an at-risk group.     Unable to reach pt  

## 2019-07-19 NOTE — Progress Notes (Signed)
    Chief Complaint:  Andrea Macias is a 38 y.o. female who presents today for a virtual office visit with a chief complaint of cough.   Assessment/Plan:  Cough / COVID Send-out Covid testing at the ED positive.  Seems to be improving.  Recommended that she continue albuterol and azithromycin.  Also start Tessalon.  Encourage good oral hydration.  Can continue using over-the-counter meds as needed.  Discussed reasons to return to care.  Follow-up as needed.    Subjective:  HPI:  Cough Started 6 days ago. Went to the UC yesterday and was started on albuterol and azithromycin. She has noticed modest improvement since then.  She went to her father's funeral 13 days ago.  No fevers or body aches.  Has tried using Alka-Seltzer and Mucinex with modest improvement.   ROS: Per HPI  PMH: She reports that she has never smoked. She has never used smokeless tobacco. She reports that she does not drink alcohol or use drugs.      Objective/Observations  Physical Exam: Gen: NAD, resting comfortably Pulm: Normal work of breathing Neuro: Grossly normal, moves all extremities Psych: Normal affect and thought content  Results for orders placed or performed during the hospital encounter of 07/18/19 (from the past 24 hour(s))  POC SARS Coronavirus 2 Ag-ED - Nasal Swab (BD Veritor Kit)     Status: None   Collection Time: 07/18/19  3:13 PM  Result Value Ref Range   SARS Coronavirus 2 Ag Negative Negative  Novel Coronavirus, NAA ([facility])     Status: Abnormal   Collection Time: 07/18/19  3:27 PM   Specimen: Nasopharyngeal Swab; Nasopharyngeal(NP) swabs in vial transport medium   NASOPHARYNGE  Result Value Ref Range   SARS-CoV-2, NAA Detected (A) Not Detected   Narrative   Performed at:  [address] [place], [place]  [id number] Lab Director: Rush Farmer MD, Phone:  [phone]     Virtual Visit via Video   I connected with Tonia Brooms on 07/19/19 at 11:20 AM  EST by a video enabled telemedicine application and verified that I am speaking with the correct person using two identifiers. The limitations of evaluation and management by telemedicine and the availability of in person appointments were discussed. The patient expressed understanding and agreed to proceed.   Patient location: Home Provider location: [place] participating in the virtual visit: Myself and Patient     Algis Greenhouse. Jerline Pain, MD 07/19/2019 11:31 AM

## 2019-07-28 ENCOUNTER — Other Ambulatory Visit: Payer: Self-pay | Admitting: Family Medicine

## 2019-08-04 ENCOUNTER — Ambulatory Visit: Payer: BC Managed Care – PPO | Attending: Internal Medicine

## 2019-08-04 DIAGNOSIS — Z20822 Contact with and (suspected) exposure to covid-19: Secondary | ICD-10-CM

## 2019-08-05 ENCOUNTER — Ambulatory Visit: Payer: BC Managed Care – PPO | Admitting: Family Medicine

## 2019-08-05 LAB — NOVEL CORONAVIRUS, NAA: SARS-CoV-2, NAA: NOT DETECTED

## 2019-08-24 DIAGNOSIS — H4711 Papilledema associated with increased intracranial pressure: Secondary | ICD-10-CM | POA: Diagnosis not present

## 2019-09-28 ENCOUNTER — Other Ambulatory Visit: Payer: Self-pay

## 2019-09-28 DIAGNOSIS — R7989 Other specified abnormal findings of blood chemistry: Secondary | ICD-10-CM

## 2019-09-28 DIAGNOSIS — I10 Essential (primary) hypertension: Secondary | ICD-10-CM

## 2019-09-28 MED ORDER — HYDROCHLOROTHIAZIDE 25 MG PO TABS: 25.0000 mg | ORAL_TABLET | Freq: Every day | ORAL | 3 refills | Status: DC

## 2019-09-28 MED ORDER — DILTIAZEM HCL ER COATED BEADS 240 MG PO CP24: 240.0000 mg | ORAL_CAPSULE | Freq: Every day | ORAL | 3 refills | Status: DC

## 2019-09-28 NOTE — Telephone Encounter (Signed)
LAST APPOINTMENT DATE: 07/19/2019  NEXT APPOINTMENT DATE:05/18/2020   LAST REFILL: 07/28/19  QTY: 4 capsule

## 2019-09-29 NOTE — Telephone Encounter (Signed)
Please notify pt. This is just a 12 week supplement. Should now be on otc vit d 4000 units daily. Thanks. No refill.

## 2019-09-29 NOTE — Telephone Encounter (Signed)
Patient notified to continue with an otc 4000 unit dose of vitamin d and that the 50000 units was just a 12 week supplement.

## 2019-10-13 ENCOUNTER — Other Ambulatory Visit: Payer: Self-pay

## 2019-10-13 NOTE — Telephone Encounter (Signed)
LAST APPOINTMENT DATE: 07/18/2020  NEXT APPOINTMENT DATE:05/18/2020   LAST REFILL: 07/28/2019  QTY: 90 CAPSULE

## 2019-10-14 DIAGNOSIS — Z6841 Body Mass Index (BMI) 40.0 and over, adult: Secondary | ICD-10-CM | POA: Diagnosis not present

## 2019-10-14 DIAGNOSIS — J310 Chronic rhinitis: Secondary | ICD-10-CM | POA: Diagnosis not present

## 2019-10-28 ENCOUNTER — Other Ambulatory Visit: Payer: Self-pay | Admitting: Family Medicine

## 2020-05-11 DIAGNOSIS — Z6841 Body Mass Index (BMI) 40.0 and over, adult: Secondary | ICD-10-CM | POA: Diagnosis not present

## 2020-05-11 DIAGNOSIS — Q019 Encephalocele, unspecified: Secondary | ICD-10-CM | POA: Diagnosis not present

## 2020-05-11 DIAGNOSIS — J310 Chronic rhinitis: Secondary | ICD-10-CM | POA: Diagnosis not present

## 2020-05-15 ENCOUNTER — Telehealth: Payer: BC Managed Care – PPO | Admitting: Family Medicine

## 2020-05-17 ENCOUNTER — Encounter: Payer: Self-pay | Admitting: Family Medicine

## 2020-05-17 ENCOUNTER — Telehealth (INDEPENDENT_AMBULATORY_CARE_PROVIDER_SITE_OTHER): Payer: BC Managed Care – PPO | Admitting: Family Medicine

## 2020-05-17 DIAGNOSIS — F419 Anxiety disorder, unspecified: Secondary | ICD-10-CM | POA: Diagnosis not present

## 2020-05-17 NOTE — Progress Notes (Signed)
Virtual Visit via Video Note  Subjective  CC:  Chief Complaint  Patient presents with  . Anxiety    having to return to office for work - many positive COVID cases in her office, wanting to work from home     I connected with [name] on 05/17/20 at  8:00 AM EDT by a video enabled telemedicine application and verified that I am speaking with the correct person using two identifiers. Location patient: Home Location provider: [facility] [address] Office Persons participating in the virtual visit: Malone Admire, Willow Ora, MD Adela Glimpse CMA  I discussed the limitations of evaluation and management by telemedicine and the availability of in person appointments. The patient expressed understanding and agreed to proceed. HPI: Andrea Macias is a 39 y.o. female who was contacted today to address the problems listed above in the chief complaint. . Works Teacher, early years/pre at [employer].  Has been working from home since the beginning of the pandemic.  Now Verne Spurr is transitioning back to the office and she is feeling quite anxious and worried about that.  She feels she is high risk because of her obesity and hypertension.  She has been vaccinated.  She will be working in a cubicle but her biggest problem is having to wear a mask.  She finds it difficult to breathe.  She has no history of mood disorder.  She denies symptoms of depression or chronic anxiety.  She is not having panic attacks.  Depression screen [facility] 2/9 05/05/2019 03/31/2019 02/17/2019  Decreased Interest 0 0 0  Down, Depressed, Hopeless 0 0 0  PHQ - 2 Score 0 0 0   GAD 7 : Generalized Anxiety Score 05/17/2020  Nervous, Anxious, on Edge 1  Control/stop worrying 1  Worry too much - different things 1  Trouble relaxing 1  Restless 0  Easily annoyed or irritable 1  Afraid - awful might happen 1  Total GAD 7 Score 6  Anxiety Difficulty Very difficult     Assessment  1. Anxiety        Plan   Anxiety related to Covid: Counseling and education given.  She has been living very isolated.  Believe it would be beneficial to get back to work if she is able.  Recommend thin mask starting to wear at home to see if she can tolerate it.  Recommend contacting her human resource department to discuss other options and accommodations.  If needed I would write a letter on her behalf.  There are no concrete medical indications that she needs to work from home.  She understands this.  Hypertension: Overdue for hypertension visit.  She will contact the office to schedule.  Overdue for blood work.   I discussed the assessment and treatment plan with the patient. The patient was provided an opportunity to ask questions and all were answered. The patient agreed with the plan and demonstrated an understanding of the instructions.   The patient was advised to call back or seek an in-person evaluation if the symptoms worsen or if the condition fails to improve as anticipated. Follow up: htn f/u    I reviewed the patients updated PMH, FH, and SocHx.    Patient Active Problem List   Diagnosis Date Noted  . CSF leak from nose 02/17/2019  . Encephalocele ([facility]) 02/17/2019  . OSA (obstructive sleep apnea) 02/17/2019  . Essential hypertension 12/05/2018  . Morbid obesity ([facility]) 11/26/2018  . Lap  Roux Y Gastric Bypass Nov 2010 02/22/2011   Current Meds  Medication Sig  . diltiazem (CARDIZEM CD) 240 MG 24 hr capsule Take 1 capsule (240 mg total) by mouth daily.  . hydrochlorothiazide (HYDRODIURIL) 25 MG tablet Take 1 tablet (25 mg total) by mouth daily.    Allergies: Patient is allergic to penicillins. Family History: Patient family history includes Glaucoma in her mother; Healthy in her brother, brother, sister, and sister; Hypertension in her father and mother; Lung cancer in her father; Non-Hodgkin's lymphoma in her mother. Social History:  Patient  reports that she has never smoked. She  has never used smokeless tobacco. She reports that she does not drink alcohol and does not use drugs.  Review of Systems: Constitutional: Negative for fever malaise or anorexia Cardiovascular: negative for chest pain Respiratory: negative for SOB or persistent cough Gastrointestinal: negative for abdominal pain  OBJECTIVE Vitals: There were no vitals taken for this visit. General: no acute distress , A&Ox3  Willow Ora, MD

## 2020-05-18 ENCOUNTER — Encounter: Payer: BC Managed Care – PPO | Admitting: Family Medicine

## 2020-11-24 ENCOUNTER — Other Ambulatory Visit: Payer: Self-pay | Admitting: Family Medicine

## 2020-11-24 DIAGNOSIS — I10 Essential (primary) hypertension: Secondary | ICD-10-CM

## 2020-11-24 NOTE — Telephone Encounter (Signed)
Schedule an appointment for further refills. 

## 2021-02-20 ENCOUNTER — Ambulatory Visit
Admission: RE | Admit: 2021-02-20 | Discharge: 2021-02-20 | Disposition: A | Discharge status: Home / Self Care | Payer: BC Managed Care – PPO | Source: Ambulatory Visit | Attending: Family Medicine | Admitting: Family Medicine

## 2021-02-20 ENCOUNTER — Other Ambulatory Visit: Payer: Self-pay

## 2021-02-20 VITALS — BP 131/77 | HR 108 | Temp 98.2°F

## 2021-02-20 DIAGNOSIS — L0591 Pilonidal cyst without abscess: Secondary | ICD-10-CM | POA: Diagnosis not present

## 2021-02-20 NOTE — Discharge Instructions (Addendum)
You have a ~15cm pilonidal cyst without infection that requires general surgery for definitive treatment. Please contact [facility] for a consultation.

## 2021-02-20 NOTE — ED Triage Notes (Signed)
Pt c/o cyst to buttocks area. States first noticed in May without symptoms. Now feels a non-painful sensation when moving/exercising. States no pain, itching, or drainage. States has had cysts before but not in this location.

## 2021-02-20 NOTE — ED Provider Notes (Signed)
[facility]   MRN: [id number] DOB: Jun 21, 1981  Subjective:   Andrea Macias is a 40 y.o. female presenting for several month history of persistent and worsening growth over the buttock area.  Denies fever, tenderness, drainage of pus or bleeding.  Denies history of pilonidal cyst.  She has had cysts in other locations but not here.  Has never sought a consultation for this.  No current facility-administered medications for this encounter.  Current Outpatient Medications:    albuterol (VENTOLIN HFA) 108 (90 Base) MCG/ACT inhaler, Inhale 2 puffs into the lungs every 4 (four) hours as needed for wheezing or shortness of breath., Disp: 18 g, Rfl: 0   azithromycin (ZITHROMAX) 250 MG tablet, Take by mouth daily., Disp: , Rfl:    benzonatate (TESSALON) 200 MG capsule, Take 1 capsule (200 mg total) by mouth 2 (two) times daily as needed for cough., Disp: 20 capsule, Rfl: 0   diltiazem (CARDIZEM CD) 240 MG 24 hr capsule, TAKE 1 CAPSULE DAILY, Disp: 30 capsule, Rfl: 0   hydrochlorothiazide (HYDRODIURIL) 25 MG tablet, Take 1 tablet (25 mg total) by mouth daily., Disp: 30 tablet, Rfl: 0   Vitamin D, Ergocalciferol, (DRISDOL) 1.25 MG (50000 UNIT) CAPS capsule, TAKE 1 CAPSULE BY MOUTH ONE TIME PER WEEK, Disp: 4 capsule, Rfl: 2   Allergies  Allergen Reactions   Penicillins Itching    Past Medical History:  Diagnosis Date   CSF leak from nose 02/17/2019   S/p repair due to meningocoele. [facility]. Resolve d2020   Hypertension    Obesity    OSA (obstructive sleep apnea) 02/17/2019     Past Surgical History:  Procedure Laterality Date   ENCEPHALOCELE REPAIR  2020   csf leak, ent at [facility] see records   ROUX-EN-Y PROCEDURE      Family History  Problem Relation Age of Onset   Hypertension Mother    Glaucoma Mother    Non-Hodgkin's lymphoma Mother    Hypertension Father    Lung cancer Father    Healthy Sister    Healthy Brother    Healthy Brother    Healthy Sister      Social History   Tobacco Use   Smoking status: Never   Smokeless tobacco: Never  Substance Use Topics   Alcohol use: Yes    Comment: occasional/monthly   Drug use: No    ROS   Objective:   Vitals: BP 131/77 (BP Location: Left Arm)   Pulse (!) 108   Temp 98.2 F (36.8 C) (Oral)   LMP 01/29/2021 (Approximate)   SpO2 93%   Physical Exam Constitutional:      General: She is not in acute distress.    Appearance: Normal appearance. She is well-developed. She is not ill-appearing, toxic-appearing or diaphoretic.  HENT:     Head: Normocephalic and atraumatic.     Nose: Nose normal.     Mouth/Throat:     Mouth: Mucous membranes are moist.     Pharynx: Oropharynx is clear.  Eyes:     General: No scleral icterus.       Right eye: No discharge.        Left eye: No discharge.     Extraocular Movements: Extraocular movements intact.     Conjunctiva/sclera: Conjunctivae normal.     Pupils: Pupils are equal, round, and reactive to light.  Cardiovascular:     Rate and Rhythm: Normal rate.  Pulmonary:     Effort: Pulmonary effort is normal.  Skin:  General: Skin is warm and dry.       Neurological:     General: No focal deficit present.     Mental Status: She is alert and oriented to person, place, and time.  Psychiatric:        Mood and Affect: Mood normal.        Behavior: Behavior normal.        Thought Content: Thought content normal.        Judgment: Judgment normal.    Assessment and Plan :   PDMP not reviewed this encounter.  1. Pilonidal cyst without infection     Patient is largely asymptomatic, without signs of infection.  Furthermore given the size of this pilonidal cyst, recommended that she seek consultation for surgical removal through [facility] surgery.  Use supportive care otherwise. Counseled patient on potential for adverse effects with medications prescribed/recommended today, ER and return-to-clinic precautions discussed, patient  verbalized understanding.    Wallis Bamberg, [place] 02/20/21 [phone]

## 2021-03-05 DIAGNOSIS — R222 Localized swelling, mass and lump, trunk: Secondary | ICD-10-CM | POA: Diagnosis not present

## 2021-03-07 ENCOUNTER — Other Ambulatory Visit: Payer: Self-pay | Admitting: General Surgery

## 2021-03-07 DIAGNOSIS — R229 Localized swelling, mass and lump, unspecified: Secondary | ICD-10-CM

## 2021-03-12 ENCOUNTER — Other Ambulatory Visit: Payer: Self-pay

## 2021-03-12 ENCOUNTER — Ambulatory Visit
Admission: RE | Admit: 2021-03-12 | Discharge: 2021-03-12 | Disposition: A | Discharge status: Home / Self Care | Payer: BC Managed Care – PPO | Source: Ambulatory Visit | Attending: General Surgery | Admitting: General Surgery

## 2021-03-12 DIAGNOSIS — R222 Localized swelling, mass and lump, trunk: Secondary | ICD-10-CM | POA: Diagnosis not present

## 2021-03-12 DIAGNOSIS — R229 Localized swelling, mass and lump, unspecified: Secondary | ICD-10-CM

## 2021-03-20 ENCOUNTER — Other Ambulatory Visit: Payer: Self-pay | Admitting: General Surgery

## 2021-03-20 DIAGNOSIS — R229 Localized swelling, mass and lump, unspecified: Secondary | ICD-10-CM

## 2021-03-31 ENCOUNTER — Other Ambulatory Visit: Payer: Self-pay

## 2021-03-31 ENCOUNTER — Ambulatory Visit
Admission: RE | Admit: 2021-03-31 | Discharge: 2021-03-31 | Disposition: A | Discharge status: Home / Self Care | Payer: BC Managed Care – PPO | Source: Ambulatory Visit | Attending: General Surgery | Admitting: General Surgery

## 2021-03-31 DIAGNOSIS — R229 Localized swelling, mass and lump, unspecified: Secondary | ICD-10-CM

## 2021-05-11 ENCOUNTER — Other Ambulatory Visit: Payer: Self-pay | Admitting: General Surgery

## 2021-05-11 DIAGNOSIS — R229 Localized swelling, mass and lump, unspecified: Secondary | ICD-10-CM

## 2021-06-07 ENCOUNTER — Other Ambulatory Visit: Payer: Self-pay

## 2021-06-07 ENCOUNTER — Ambulatory Visit
Admission: RE | Admit: 2021-06-07 | Discharge: 2021-06-07 | Disposition: A | Discharge status: Home / Self Care | Payer: BC Managed Care – PPO | Source: Ambulatory Visit | Attending: General Surgery | Admitting: General Surgery

## 2021-06-07 DIAGNOSIS — L729 Follicular cyst of the skin and subcutaneous tissue, unspecified: Secondary | ICD-10-CM | POA: Diagnosis not present

## 2021-06-07 DIAGNOSIS — L989 Disorder of the skin and subcutaneous tissue, unspecified: Secondary | ICD-10-CM | POA: Diagnosis not present

## 2021-06-07 DIAGNOSIS — R229 Localized swelling, mass and lump, unspecified: Secondary | ICD-10-CM

## 2021-06-07 DIAGNOSIS — R222 Localized swelling, mass and lump, trunk: Secondary | ICD-10-CM | POA: Diagnosis not present

## 2021-06-07 MED ORDER — IOPAMIDOL (ISOVUE-300) INJECTION 61%
125.0000 mL | Freq: Once | INTRAVENOUS | Status: AC | PRN
  Administered 2021-06-07: 12:00:00 125 mL via INTRAVENOUS

## 2021-10-11 ENCOUNTER — Ambulatory Visit (INDEPENDENT_AMBULATORY_CARE_PROVIDER_SITE_OTHER): Payer: BC Managed Care – PPO | Admitting: Family Medicine

## 2021-10-11 ENCOUNTER — Encounter: Payer: Self-pay | Admitting: Family Medicine

## 2021-10-11 VITALS — BP 196/92 | HR 92 | Temp 97.2°F | Ht 65.0 in | Wt >= 6400 oz

## 2021-10-11 DIAGNOSIS — Z23 Encounter for immunization: Secondary | ICD-10-CM

## 2021-10-11 DIAGNOSIS — E1159 Type 2 diabetes mellitus with other circulatory complications: Secondary | ICD-10-CM | POA: Diagnosis not present

## 2021-10-11 DIAGNOSIS — I10 Essential (primary) hypertension: Secondary | ICD-10-CM | POA: Diagnosis not present

## 2021-10-11 DIAGNOSIS — I160 Hypertensive urgency: Secondary | ICD-10-CM

## 2021-10-11 LAB — CBC WITH DIFFERENTIAL/PLATELET
Basophils Absolute: 0 10*3/uL (ref 0.0–0.1)
Basophils Relative: 0.4 % (ref 0.0–3.0)
Eosinophils Absolute: 0.1 10*3/uL (ref 0.0–0.7)
Eosinophils Relative: 0.8 % (ref 0.0–5.0)
HCT: 38.4 % (ref 36.0–46.0)
Hemoglobin: 12.4 g/dL (ref 12.0–15.0)
Lymphocytes Relative: 25.8 % (ref 12.0–46.0)
Lymphs Abs: 1.8 10*3/uL (ref 0.7–4.0)
MCHC: 32.4 g/dL (ref 30.0–36.0)
MCV: 85.4 fl (ref 78.0–100.0)
Monocytes Absolute: 0.5 10*3/uL (ref 0.1–1.0)
Monocytes Relative: 6.7 % (ref 3.0–12.0)
Neutro Abs: 4.7 10*3/uL (ref 1.4–7.7)
Neutrophils Relative %: 66.3 % (ref 43.0–77.0)
Platelets: 330 10*3/uL (ref 150.0–400.0)
RBC: 4.5 Mil/uL (ref 3.87–5.11)
RDW: 16.9 % — ABNORMAL HIGH (ref 11.5–15.5)
WBC: 7 10*3/uL (ref 4.0–10.5)

## 2021-10-11 LAB — LIPID PANEL
Cholesterol: 140 mg/dL (ref 0–200)
HDL: 39.1 mg/dL (ref >39.00)
LDL Cholesterol: 83 mg/dL (ref 0–99)
NonHDL: 100.61
Total CHOL/HDL Ratio: 4
Triglycerides: 87 mg/dL (ref 0.0–149.0)
VLDL: 17.4 mg/dL (ref 0.0–40.0)

## 2021-10-11 LAB — COMPREHENSIVE METABOLIC PANEL
ALT: 15 U/L (ref 0–35)
AST: 16 U/L (ref 0–37)
Albumin: 4.3 g/dL (ref 3.5–5.2)
Alkaline Phosphatase: 62 U/L (ref 39–117)
BUN: 14 mg/dL (ref 6–23)
CO2: 30 mEq/L (ref 19–32)
Calcium: 10.1 mg/dL (ref 8.4–10.5)
Chloride: 105 mEq/L (ref 96–112)
Creatinine, Ser: 1.12 mg/dL (ref 0.40–1.20)
GFR: 60.97 mL/min (ref >60.00)
Glucose, Bld: 116 mg/dL — ABNORMAL HIGH (ref 70–99)
Potassium: 4.4 mEq/L (ref 3.5–5.1)
Sodium: 144 mEq/L (ref 135–145)
Total Bilirubin: 0.3 mg/dL (ref 0.2–1.2)
Total Protein: 8.1 g/dL (ref 6.0–8.3)

## 2021-10-11 LAB — HEMOGLOBIN A1C: Hgb A1c MFr Bld: 6.7 % — ABNORMAL HIGH (ref 4.6–6.5)

## 2021-10-11 LAB — TSH: TSH: 2.28 u[IU]/mL (ref 0.35–5.50)

## 2021-10-11 MED ORDER — LOSARTAN POTASSIUM 100 MG PO TABS: 100.0000 mg | ORAL_TABLET | Freq: Every day | ORAL | 1 refills | Status: DC

## 2021-10-11 MED ORDER — DILTIAZEM HCL ER COATED BEADS 240 MG PO CP24: 240.0000 mg | ORAL_CAPSULE | Freq: Every day | ORAL | 1 refills | Status: DC

## 2021-10-11 NOTE — Patient Instructions (Signed)
Please return in 3-4 weeks for recheck blood pressure and go over lab results.  ? ?Start your blood pressure pills together daily.  ? ?I will release your lab results to you on your MyChart account with further instructions. You may see the results before I do, but when I review them I will send you a message with my report or have my assistant call you if things need to be discussed. Please reply to my message with any questions. Thank you!  ? ?If you have any questions or concerns, please don't hesitate to send me a message via MyChart or call the office at [phone]. Thank you for visiting with [place] today! It's our pleasure caring for you.  ?

## 2021-10-11 NOTE — Progress Notes (Signed)
? ? ?Subjective  ?CC:  ?Chief Complaint  ?Patient presents with  ? Hypertension  ?  She denies headache, SOB, Chest pain, or leg swelling.   ? Obesity  ?  Pt is requesting Mounjaro/Ozempic for weight loss.   ? ? ?HPI: Andrea Macias is a 41 y.o. female who presents to the office today to address the problems listed above in the chief complaint. ?Pt was last here 2.5 years ago. At that time was being treated for htn and newly diagnosed diabetes. She has not had further care since then and has run out of bp meds; she has been taking them 2-3x/week. She is interested in help with weight loss.  ?Hypertension f/u: bp was extremely elevated upon presentation; unsure about accuracy due to body habitus and bp machine. After resting her and using thigh cuff on upper right arm, bp was 192/96.She ran out of ccb weeks ago and has been intermittently taking hctz. She reports she feels well w/o cp, palpitations, leg edema, headache, gross hematuria or sob.  ?Diabetes w/ A1c of 6.8 in 2020. Denies sxs of hyperglycemia. Diet is poor.  ?Morbid obesity: interested in weight loss medications.  ? ?Assessment  ?1. Hypertensive urgency   ?2. Morbid obesity (HCC)   ?3. Need for immunization against influenza   ?4. Hypertension associated with type 2 diabetes mellitus (HCC)   ?5. Essential hypertension   ? ?  ?Plan  ? ?Hypertension f/u: BP control is poorly controlled. Don't feel truly hypertensive urgency; but very uncontrolled HTN> had long discussion about goals of cares and risks of untreated htn. Start cardizem cd 240 and losartan 100 daily. Avoid hctz because pt doesn't take it regularly due to diuretic effects. Check renal function and lipids. Close f/u  ?diabetes f/u: will check a1c; pt is asymptomatic. Will need to address at f/u visit. Glp-1 agonist will be a good choice.  ?Check lipids ?Discussed importance of regular follow up.  ? ?Education regarding management of these chronic disease states was given. Management  strategies discussed on successive visits include dietary and exercise recommendations, goals of achieving and maintaining IBW, and lifestyle modifications aiming for adequate sleep and minimizing stressors.  ? ?Follow up: 3-4 weeks for recheck and go over results ? ?Orders Placed This Encounter  ?Procedures  ? Flu Vaccine QUAD 45mo+IM (Fluarix, Fluzone & Alfiuria Quad PF)  ? CBC with Differential/Platelet  ? Comprehensive metabolic panel  ? Hemoglobin A1c  ? Lipid panel  ? TSH  ? ?Meds ordered this encounter  ?Medications  ? diltiazem (CARDIZEM CD) 240 MG 24 hr capsule  ?  Sig: Take 1 capsule (240 mg total) by mouth daily.  ?  Dispense:  90 capsule  ?  Refill:  1  ? losartan (COZAAR) 100 MG tablet  ?  Sig: Take 1 tablet (100 mg total) by mouth daily.  ?  Dispense:  90 tablet  ?  Refill:  1  ? ?  ? ?BP Readings from Last 3 Encounters:  ?10/11/21 (!) 196/92  ?02/20/21 131/77  ?07/18/19 138/89  ? ?Wt Readings from Last 3 Encounters:  ?10/11/21 (!) 439 lb (199.1 kg)  ?05/05/19 (!) 423 lb 12.8 oz (192.2 kg)  ?03/31/19 (!) 420 lb (190.5 kg)  ? ? ?Lab Results  ?Component Value Date  ? CHOL 155 05/05/2019  ? ?Lab Results  ?Component Value Date  ? HDL 41.60 05/05/2019  ? ?Lab Results  ?Component Value Date  ? LDLCALC 92 05/05/2019  ? ?Lab Results  ?Component Value  Date  ? TRIG 107.0 05/05/2019  ? ?Lab Results  ?Component Value Date  ? CHOLHDL 4 05/05/2019  ? ?No results found for: LDLDIRECT ?Lab Results  ?Component Value Date  ? CREATININE 0.87 05/05/2019  ? BUN 15 05/05/2019  ? NA 141 05/05/2019  ? K 3.6 05/05/2019  ? CL 103 05/05/2019  ? CO2 25 05/05/2019  ? ? ?The 10-year ASCVD risk score (Arnett DK, et al., 2019) is: 44.4% ?  Values used to calculate the score: ?    Age: 54 years ?    Sex: Female ?    Is Non-Hispanic African American: Yes ?    Diabetic: Yes ?    Tobacco smoker: No ?    Systolic Blood Pressure: 196 mmHg ?    Is BP treated: Yes ?    HDL Cholesterol: 41.6 mg/dL ?    Total Cholesterol: 155 mg/dL ? ?I  reviewed the patients updated PMH, FH, and SocHx.  ?  ?Patient Active Problem List  ? Diagnosis Date Noted  ? CSF leak from nose 02/17/2019  ? Encephalocele (HCC) 02/17/2019  ? OSA (obstructive sleep apnea) 02/17/2019  ? Essential hypertension 12/05/2018  ? Morbid obesity (HCC) 11/26/2018  ? Lap Roux Y Gastric Bypass Nov 2010 02/22/2011  ? ? ?Allergies: Penicillins ? ?Social History: ?Patient  reports that she has never smoked. She has never used smokeless tobacco. She reports current alcohol use. She reports that she does not use drugs. ? ?Current Meds  ?Medication Sig  ? losartan (COZAAR) 100 MG tablet Take 1 tablet (100 mg total) by mouth daily.  ? [DISCONTINUED] diltiazem (CARDIZEM CD) 240 MG 24 hr capsule TAKE 1 CAPSULE DAILY  ? [DISCONTINUED] hydrochlorothiazide (HYDRODIURIL) 25 MG tablet Take 1 tablet (25 mg total) by mouth daily.  ? ? ?Review of Systems: ?Cardiovascular: negative for chest pain, palpitations, leg swelling, orthopnea ?Respiratory: negative for SOB, wheezing or persistent cough ?Gastrointestinal: negative for abdominal pain ?Genitourinary: negative for dysuria or gross hematuria ? ?Objective  ?Vitals: BP (!) 196/92   Pulse 92   Temp (!) 97.2 ?F (36.2 ?C) (Temporal)   Ht 5\' 5"  (1.651 m)   Wt (!) 439 lb (199.1 kg)   SpO2 98%   BMI 73.05 kg/m?  ?General: no acute distress  ?Psych:  Alert and oriented, normal mood and affect ?HEENT:  Normocephalic, atraumatic, supple neck  ?Cardiovascular:  RRR without murmur. no edema ?Respiratory:  Good breath sounds bilaterally, CTAB with normal respiratory effort ?Skin:  Warm, no rashes ?Neurologic:   Mental status is normal ?Commons side effects, risks, benefits, and alternatives for medications and treatment plan prescribed today were discussed, and the patient expressed understanding of the given instructions. Patient is instructed to call or message via MyChart if he/she has any questions or concerns regarding our treatment plan. No barriers to  understanding were identified. We discussed Red Flag symptoms and signs in detail. Patient expressed understanding regarding what to do in case of urgent or emergency type symptoms.  ?Medication list was reconciled, printed and provided to the patient in AVS. Patient instructions and summary information was reviewed with the patient as documented in the AVS. ?This note was prepared with assistance of . Occasional wrong-word or sound-a-like substitutions may have occurred due to the inherent limitations of voice recognition software ? ?This visit occurred during the SARS-CoV-2 public health emergency.  Safety protocols were in place, including screening questions prior to the visit, additional usage of staff PPE, and extensive cleaning of  exam room while observing appropriate contact time as indicated for disinfecting solutions.  ?

## 2021-11-19 ENCOUNTER — Encounter: Payer: Self-pay | Admitting: Family Medicine

## 2021-11-19 ENCOUNTER — Ambulatory Visit (INDEPENDENT_AMBULATORY_CARE_PROVIDER_SITE_OTHER): Payer: BC Managed Care – PPO | Admitting: Family Medicine

## 2021-11-19 VITALS — BP 148/90 | HR 110 | Temp 98.3°F | Ht 65.0 in | Wt >= 6400 oz

## 2021-11-19 DIAGNOSIS — Q019 Encephalocele, unspecified: Secondary | ICD-10-CM

## 2021-11-19 DIAGNOSIS — E782 Mixed hyperlipidemia: Secondary | ICD-10-CM

## 2021-11-19 DIAGNOSIS — I152 Hypertension secondary to endocrine disorders: Secondary | ICD-10-CM

## 2021-11-19 DIAGNOSIS — M545 Low back pain, unspecified: Secondary | ICD-10-CM

## 2021-11-19 DIAGNOSIS — E119 Type 2 diabetes mellitus without complications: Secondary | ICD-10-CM

## 2021-11-19 DIAGNOSIS — E1159 Type 2 diabetes mellitus with other circulatory complications: Secondary | ICD-10-CM | POA: Diagnosis not present

## 2021-11-19 DIAGNOSIS — E1169 Type 2 diabetes mellitus with other specified complication: Secondary | ICD-10-CM

## 2021-11-19 DIAGNOSIS — E1129 Type 2 diabetes mellitus with other diabetic kidney complication: Secondary | ICD-10-CM | POA: Insufficient documentation

## 2021-11-19 LAB — MICROALBUMIN / CREATININE URINE RATIO
Creatinine,U: 63.3 mg/dL
Microalb Creat Ratio: 79.8 mg/g — ABNORMAL HIGH (ref 0.0–30.0)
Microalb, Ur: 50.5 mg/dL — ABNORMAL HIGH (ref 0.0–1.9)

## 2021-11-19 MED ORDER — TIRZEPATIDE 5 MG/0.5ML ~~LOC~~ SOAJ: 5.0000 mg | SUBCUTANEOUS | 1 refills | Status: DC

## 2021-11-19 MED ORDER — DILTIAZEM HCL ER COATED BEADS 360 MG PO CP24: 360.0000 mg | ORAL_CAPSULE | Freq: Every day | ORAL | 3 refills | Status: DC

## 2021-11-19 MED ORDER — ROSUVASTATIN CALCIUM 10 MG PO TABS: 10.0000 mg | ORAL_TABLET | Freq: Every day | ORAL | 3 refills | Status: DC

## 2021-11-19 MED ORDER — TIRZEPATIDE 2.5 MG/0.5ML ~~LOC~~ SOAJ: 2.5000 mg | SUBCUTANEOUS | 0 refills | Status: DC

## 2021-11-19 NOTE — Patient Instructions (Signed)
Please return in 3 months for diabetes follow up  ? ?Please start the higher dose of the diltiazem, the rosuvastatin for cholesterol and we will see if we can get you approved for [facility] for your diabetes.  ? ?Today you were given the prevnar 20 vaccination to protect from certain pneumonias.  ? ?If you have any questions or concerns, please don't hesitate to send me a message via MyChart or call the office at [phone]. Thank you for visiting with us today! It's our pleasure caring for you.  ? ?Diabetes Mellitus and Nutrition, Adult ?When you have diabetes, or diabetes mellitus, it is very important to have healthy eating habits because your blood sugar (glucose) levels are greatly affected by what you eat and drink. Eating healthy foods in the right amounts, at about the same times every day, can help you: ?Manage your blood glucose. ?Lower your risk of heart disease. ?Improve your blood pressure. ?Reach or maintain a healthy weight. ?What can affect my meal plan? ?Every person with diabetes is different, and each person has different needs for a meal plan. Your health care provider may recommend that you work with a dietitian to make a meal plan that is best for you. Your meal plan may vary depending on factors such as: ?The calories you need. ?The medicines you take. ?Your weight. ?Your blood glucose, blood pressure, and cholesterol levels. ?Your activity level. ?Other health conditions you have, such as heart or kidney disease. ?How do carbohydrates affect me? ?Carbohydrates, also called carbs, affect your blood glucose level more than any other type of food. Eating carbs raises the amount of glucose in your blood. ?It is important to know how many carbs you can safely have in each meal. This is different for every person. Your dietitian can help you calculate how many carbs you should have at each meal and for each snack. ?How does alcohol affect me? ?Alcohol can cause a decrease in blood  glucose (hypoglycemia), especially if you use insulin or take certain diabetes medicines by mouth. Hypoglycemia can be a life-threatening condition. Symptoms of hypoglycemia, such as sleepiness, dizziness, and confusion, are similar to symptoms of having too much alcohol. ?Do not drink alcohol if: ?Your health care provider tells you not to drink. ?You are pregnant, may be pregnant, or are planning to become pregnant. ?If you drink alcohol: ?Limit how much you have to: ?0-1 drink a day for women. ?0-2 drinks a day for men. ?Know how much alcohol is in your drink. In the [place] one drink equals one 12 oz bottle of beer (355 mL), one 5 oz glass of wine (148 mL), or one 1? oz glass of hard liquor (44 mL). ?Keep yourself hydrated with water, diet soda, or unsweetened iced tea. Keep in mind that regular soda, juice, and other mixers may contain a lot of sugar and must be counted as carbs. ?What are tips for following this plan? ? ?Reading food labels ?Start by checking the serving size on the Nutrition Facts label of packaged foods and drinks. The number of calories and the amount of carbs, fats, and other nutrients listed on the label are based on one serving of the item. Many items contain more than one serving per package. ?Check the total grams (g) of carbs in one serving. ?Check the number of grams of saturated fats and trans fats in one serving. Choose foods that have a low amount or none of these fats. ?Check the number of milligrams (mg)  of salt (sodium) in one serving. Most people should limit total sodium intake to less than 2,300 mg per day. ?Always check the nutrition information of foods labeled as "low-fat" or "nonfat." These foods may be higher in added sugar or refined carbs and should be avoided. ?Talk to your dietitian to identify your daily goals for nutrients listed on the label. ?Shopping ?Avoid buying canned, pre-made, or processed foods. These foods tend to be high in fat, sodium, and added  sugar. ?Shop around the outside edge of the grocery store. This is where you will most often find fresh fruits and vegetables, bulk grains, fresh meats, and fresh dairy products. ?Cooking ?Use low-heat cooking methods, such as baking, instead of high-heat cooking methods, such as deep frying. ?Cook using healthy oils, such as olive, canola, or sunflower oil. ?Avoid cooking with butter, cream, or high-fat meats. ?Meal planning ?Eat meals and snacks regularly, preferably at the same times every day. Avoid going long periods of time without eating. ?Eat foods that are high in fiber, such as fresh fruits, vegetables, beans, and whole grains. ?Eat 4-6 oz (112-168 g) of lean protein each day, such as lean meat, chicken, fish, eggs, or tofu. One ounce (oz) (28 g) of lean protein is equal to: ?1 oz (28 g) of meat, chicken, or fish. ?1 egg. ?? cup (62 g) of tofu. ?Eat some foods each day that contain healthy fats, such as avocado, nuts, seeds, and fish. ?What foods should I eat? ?Fruits ?Berries. Apples. Oranges. Peaches. Apricots. Plums. Grapes. Mangoes. Papayas. Pomegranates. Kiwi. Cherries. ?Vegetables ?Leafy greens, including lettuce, spinach, kale, chard, collard greens, mustard greens, and cabbage. Beets. Cauliflower. Broccoli. Carrots. Green beans. Tomatoes. Peppers. Onions. Cucumbers. Brussels sprouts. ?Grains ?Whole grains, such as whole-wheat or whole-grain bread, crackers, tortillas, cereal, and pasta. Unsweetened oatmeal. Quinoa. Brown or wild rice. ?Meats and other proteins ?Seafood. Poultry without skin. Lean cuts of poultry and beef. Tofu. Nuts. Seeds. ?Dairy ?Low-fat or fat-free dairy products such as milk, yogurt, and cheese. ?The items listed above may not be a complete list of foods and beverages you can eat and drink. Contact a dietitian for more information. ?What foods should I avoid? ?Fruits ?Fruits canned with syrup. ?Vegetables ?Canned vegetables. Frozen vegetables with butter or cream  sauce. ?Grains ?Refined white flour and flour products such as bread, pasta, snack foods, and cereals. Avoid all processed foods. ?Meats and other proteins ?Fatty cuts of meat. Poultry with skin. Breaded or fried meats. Processed meat. Avoid saturated fats. ?Dairy ?Full-fat yogurt, cheese, or milk. ?Beverages ?Sweetened drinks, such as soda or iced tea. ?The items listed above may not be a complete list of foods and beverages you should avoid. Contact a dietitian for more information. ?Questions to ask a health care provider ?Do I need to meet with a certified diabetes care and education specialist? ?Do I need to meet with a dietitian? ?What number can I call if I have questions? ?When are the best times to check my blood glucose? ?Where to find more information: ?American Diabetes Association: diabetes.org ?Academy of Nutrition and Dietetics: eatright.org ?[employer] of Diabetes and Digestive and Kidney Diseases: StageSync.si ?Association of Diabetes Care & Education Specialists: diabeteseducator.org ?Summary ?It is important to have healthy eating habits because your blood sugar (glucose) levels are greatly affected by what you eat and drink. It is important to use alcohol carefully. ?A healthy meal plan will help you manage your blood glucose and lower your risk of heart disease. ?Your health care provider may  recommend that you work with a dietitian to make a meal plan that is best for you. ?This information is not intended to replace advice given to you by your health care provider. Make sure you discuss any questions you have with your health care provider. ?Document Revised: 02/09/2020 Document Reviewed: 02/09/2020 ?Elsevier Patient Education ? 2023 Elsevier Inc. ? ?

## 2021-11-19 NOTE — Progress Notes (Signed)
? ?Subjective  ?CC:  ?Chief Complaint  ?Patient presents with  ? Hypertension  ?  Pt here to F/U with BP nd lab results  ? ? ?HPI: Andrea Macias is a 41 y.o. female who presents to the office today for follow up of diabetes and problems listed above in the chief complaint.  ?HTN: Taking diltiazem extended release 240 daily and losartan 100 daily.  Tolerating both well.  No chest pain or shortness of breath. ?Diabetes follow up: Reviewed recent lab work.  A1c is now in the diabetic range.  Keep working on diet.  No foot concerns.  Eligible for Prevnar 20. ?Hyperlipidemia: LDL is 83.  Eligible for statin. ?Morbid obesity: Working on diet. ?Complains of 1 week history of low back pain.  Dull aching worse with prolonged standing or walking.  No pain at night.  No radicular symptoms.  No bowel or bladder.  No injury.  Not taking anything for ? ?Wt Readings from Last 3 Encounters:  ?11/19/21 (!) 444 lb 9.6 oz (201.7 kg)  ?10/11/21 (!) 439 lb (199.1 kg)  ?05/05/19 (!) 423 lb 12.8 oz (192.2 kg)  ? ? ?BP Readings from Last 3 Encounters:  ?11/19/21 (!) 148/90  ?10/11/21 (!) 196/92  ?02/20/21 131/77  ? ? ?Assessment  ?1. Hypertension associated with type 2 diabetes mellitus ([place])   ?2. Type 2 diabetes mellitus without complication, without long-term current use of insulin ([place])   ?3. Combined hyperlipidemia associated with type 2 diabetes mellitus ([place])   ?4. Morbid obesity ([place])   ?5. Encephalocele (HCC) Chronic  ?6. Low back pain, episodic   ? ?  ?Plan  ?Diabetes is currently well controlled.  We will try to add GLP-1 agonist to help control diabetes and help with weight loss.  Education given.  Check urine screen today.  Prevnar 20 given today.  Recommend eye exam ?Hypertension: Much improved: We will increase Cardizem CD to 360 mg daily continue losartan 100 mg daily.  Recheck 3 months ?Hyperlipidemia: Educated on statins.  Start Crestor 10 daily ?Obesity: We will see if can get GLP-1 agonist approved.  Should help her  diabetes as well.  See after visit summary for nutrition education ?Low back pain: Likely musculoskeletal start Advil 600 twice daily for a week, IcyHot, stretches and follow-up if not improving. ? ?Follow up: 3 months to recheck hypertension, diabetes. ?Orders Placed This Encounter  ?Procedures  ? Microalbumin / creatinine urine ratio  ? ?Meds ordered this encounter  ?Medications  ? rosuvastatin (CRESTOR) 10 MG tablet  ?  Sig: Take 1 tablet (10 mg total) by mouth daily.  ?  Dispense:  90 tablet  ?  Refill:  3  ? diltiazem (CARDIZEM CD) 360 MG 24 hr capsule  ?  Sig: Take 1 capsule (360 mg total) by mouth daily.  ?  Dispense:  90 capsule  ?  Refill:  3  ?  Increasing from 240mg  dose; please cancel that prescription  ? tirzepatide [facility]) 2.5 MG/0.5ML Pen  ?  Sig: Inject 2.5 mg into the skin once a week.  ?  Dispense:  2 mL  ?  Refill:  0  ? tirzepatide (MOUNJARO) 5 MG/0.5ML Pen  ?  Sig: Inject 5 mg into the skin once a week.  ?  Dispense:  2 mL  ?  Refill:  1  ? ?  ? ?Immunization History  ?Administered Date(s) Administered  ? Influenza,inj,Quad PF,6+ Mos 05/05/2019, 10/11/2021  ? Janssen (J&J) SARS-COV-2 Vaccination 10/15/2019  ? Tdap  05/05/2019  ? ? ?Diabetes Related Lab Review: ?Lab Results  ?Component Value Date  ? HGBA1C 6.7 (H) 10/11/2021  ? HGBA1C 6.8 (H) 05/07/2019  ?  ?No results found for: [place] ?Lab Results  ?Component Value Date  ? CREATININE 1.12 10/11/2021  ? BUN 14 10/11/2021  ? NA 144 10/11/2021  ? K 4.4 10/11/2021  ? CL 105 10/11/2021  ? CO2 30 10/11/2021  ? ?Lab Results  ?Component Value Date  ? CHOL 140 10/11/2021  ? CHOL 155 05/05/2019  ? ?Lab Results  ?Component Value Date  ? HDL 39.10 10/11/2021  ? HDL 41.60 05/05/2019  ? ?Lab Results  ?Component Value Date  ? LDLCALC 83 10/11/2021  ? LDLCALC 92 05/05/2019  ? ?Lab Results  ?Component Value Date  ? TRIG 87.0 10/11/2021  ? TRIG 107.0 05/05/2019  ? ?Lab Results  ?Component Value Date  ? CHOLHDL 4 10/11/2021  ? CHOLHDL 4  05/05/2019  ? ?No results found for: LDLDIRECT ?The 10-year ASCVD risk score (Arnett DK, et al., 2019) is: 12.6% ?  Values used to calculate the score: ?    Age: 23 years ?    Sex: Female ?    Is Non-Hispanic African American: Yes ?    Diabetic: Yes ?    Tobacco smoker: No ?    Systolic Blood Pressure: 148 mmHg ?    Is BP treated: Yes ?    HDL Cholesterol: 39.1 mg/dL ?    Total Cholesterol: 140 mg/dL ?I have reviewed the PMH, Fam and Soc history. ?Patient Active Problem List  ? Diagnosis Date Noted  ? Type 2 diabetes mellitus without complication, without long-term current use of insulin (HCC) 11/19/2021  ? Combined hyperlipidemia associated with type 2 diabetes mellitus (HCC) 11/19/2021  ? CSF leak from nose 02/17/2019  ?  S/p repair due to meningocoele. See [facility] records. [facility] d2020 ? ?  ? Encephalocele (HCC) 02/17/2019  ? OSA (obstructive sleep apnea) 02/17/2019  ? Hypertension associated with type 2 diabetes mellitus (HCC) 12/05/2018  ? Morbid obesity (HCC) 11/26/2018  ? Lap Roux Y Gastric Bypass Nov 2010 02/22/2011  ?  Surgery date: 05/22/09 ? ?  ? ? ?Social History: ?Patient  reports that she has never smoked. She has never used smokeless tobacco. She reports current alcohol use. She reports that she does not use drugs. ? ?Review of Systems: ?Ophthalmic: negative for eye pain, loss of vision or double vision ?Cardiovascular: negative for chest pain ?Respiratory: negative for SOB or persistent cough ?Gastrointestinal: negative for abdominal pain ?Genitourinary: negative for dysuria or gross hematuria ?MSK: negative for foot lesions ?Neurologic: negative for weakness or gait disturbance ? ?Objective  ?Vitals: BP (!) 148/90 Comment: Lt Arm  Pulse (!) 110   Temp 98.3 ?F (36.8 ?C)   Ht 5\' 5"  (1.651 m)   Wt (!) 444 lb 9.6 oz (201.7 kg)   SpO2 98%   BMI 73.99 kg/m?  ?General: well appearing, no acute distress  ?Psych:  Alert and oriented, normal mood and affect ?HEENT:  Normocephalic, atraumatic, moist mucous  membranes, supple neck  ?Cardiovascular:  Nl S1 and S2, RRR without murmur, gallop or rub. no edema ?Respiratory:  Good breath sounds bilaterally, CTAB with normal effort, no rales ?Foot exam: no erythema, pallor, or cyanosis visible nl proprioception and sensation to monofilament testing bilaterally, +2 distal pulses bilaterally ? ? ? ?Diabetic education: ongoing education regarding chronic disease management for diabetes was given today. We continue to reinforce the ABC's of diabetic  management: A1c (<7 or 8 dependent upon patient), tight blood pressure control, and cholesterol management with goal LDL < 100 minimally. We discuss diet strategies, exercise recommendations, medication options and possible side effects. At each visit, we review recommended immunizations and preventive care recommendations for diabetics and stress that good diabetic control can prevent other problems. See below for this patient's data. ? ? ?Commons side effects, risks, benefits, and alternatives for medications and treatment plan prescribed today were discussed, and the patient expressed understanding of the given instructions. Patient is instructed to call or message via MyChart if he/she has any questions or concerns regarding our treatment plan. No barriers to understanding were identified. We discussed Red Flag symptoms and signs in detail. Patient expressed understanding regarding what to do in case of urgent or emergency type symptoms.  ?Medication list was reconciled, printed and provided to the patient in AVS. Patient instructions and summary information was reviewed with the patient as documented in the AVS. ?This note was prepared with assistance of Systems analyst. Occasional wrong-word or sound-a-like substitutions may have occurred due to the inherent limitations of voice recognition software ? ?This visit occurred during the SARS-CoV-2 public health emergency.  Safety protocols were in place, including  screening questions prior to the visit, additional usage of staff PPE, and extensive cleaning of exam room while observing appropriate contact time as indicated for disinfecting solutions.  ? ?

## 2021-11-19 NOTE — Progress Notes (Signed)
+   urine microalbunuria. Now on ARB/ ?Monitor ... may warrant farxiga.

## 2021-11-27 ENCOUNTER — Telehealth: Payer: Self-pay

## 2021-11-27 NOTE — Telephone Encounter (Signed)
Andrea Macias has been approved CASE ID [id number] ?

## 2021-12-04 ENCOUNTER — Encounter: Payer: Self-pay | Admitting: Family Medicine

## 2021-12-06 ENCOUNTER — Other Ambulatory Visit: Payer: Self-pay | Admitting: Family Medicine

## 2021-12-06 MED ORDER — OZEMPIC (0.25 OR 0.5 MG/DOSE) 2 MG/1.5ML ~~LOC~~ SOPN: 0.2500 mg | PEN_INJECTOR | SUBCUTANEOUS | 0 refills | Status: DC

## 2021-12-10 MED ORDER — OZEMPIC (0.25 OR 0.5 MG/DOSE) 2 MG/3ML ~~LOC~~ SOPN: PEN_INJECTOR | SUBCUTANEOUS | 1 refills | Status: DC

## 2021-12-19 ENCOUNTER — Telehealth: Payer: Self-pay

## 2021-12-19 NOTE — Telephone Encounter (Signed)
Ozempic has been approved effective 11/14/2021-12/14/2022 thru [employer].

## 2022-01-31 ENCOUNTER — Encounter: Payer: Self-pay | Admitting: Family Medicine

## 2022-02-11 ENCOUNTER — Other Ambulatory Visit: Payer: Self-pay | Admitting: Family Medicine

## 2022-02-12 ENCOUNTER — Other Ambulatory Visit: Payer: Self-pay

## 2022-02-12 MED ORDER — OZEMPIC (0.25 OR 0.5 MG/DOSE) 2 MG/3ML ~~LOC~~ SOPN: PEN_INJECTOR | SUBCUTANEOUS | 2 refills | Status: DC

## 2022-02-22 ENCOUNTER — Ambulatory Visit: Payer: BC Managed Care – PPO | Admitting: Family Medicine

## 2022-02-25 ENCOUNTER — Encounter: Payer: Self-pay | Admitting: Family Medicine

## 2022-02-25 ENCOUNTER — Ambulatory Visit (INDEPENDENT_AMBULATORY_CARE_PROVIDER_SITE_OTHER): Payer: BC Managed Care – PPO | Admitting: Family Medicine

## 2022-02-25 VITALS — BP 138/82 | HR 80 | Temp 97.5°F | Ht 65.0 in | Wt >= 6400 oz

## 2022-02-25 DIAGNOSIS — E1169 Type 2 diabetes mellitus with other specified complication: Secondary | ICD-10-CM | POA: Diagnosis not present

## 2022-02-25 DIAGNOSIS — G8929 Other chronic pain: Secondary | ICD-10-CM

## 2022-02-25 DIAGNOSIS — I152 Hypertension secondary to endocrine disorders: Secondary | ICD-10-CM | POA: Diagnosis not present

## 2022-02-25 DIAGNOSIS — R809 Proteinuria, unspecified: Secondary | ICD-10-CM

## 2022-02-25 DIAGNOSIS — E119 Type 2 diabetes mellitus without complications: Secondary | ICD-10-CM | POA: Diagnosis not present

## 2022-02-25 DIAGNOSIS — E1159 Type 2 diabetes mellitus with other circulatory complications: Secondary | ICD-10-CM | POA: Diagnosis not present

## 2022-02-25 DIAGNOSIS — M545 Low back pain, unspecified: Secondary | ICD-10-CM

## 2022-02-25 DIAGNOSIS — E782 Mixed hyperlipidemia: Secondary | ICD-10-CM

## 2022-02-25 DIAGNOSIS — E1129 Type 2 diabetes mellitus with other diabetic kidney complication: Secondary | ICD-10-CM

## 2022-02-25 DIAGNOSIS — Z1159 Encounter for screening for other viral diseases: Secondary | ICD-10-CM | POA: Diagnosis not present

## 2022-02-25 LAB — POCT GLYCOSYLATED HEMOGLOBIN (HGB A1C): Hemoglobin A1C: 6.1 % — AB (ref 4.0–5.6)

## 2022-02-25 MED ORDER — SEMAGLUTIDE (1 MG/DOSE) 4 MG/3ML ~~LOC~~ SOPN: 1.0000 mg | PEN_INJECTOR | SUBCUTANEOUS | 5 refills | Status: DC

## 2022-02-25 NOTE — Progress Notes (Signed)
Subjective  CC:  Chief Complaint  Patient presents with   Diabetes    Pt is here for three month diabetes f/u. She stated that she does not check her glucose levels at home. Wants to discuss bilateral hip/pelvis pain.    HPI: Andrea Macias is a 41 y.o. female who presents to the office today for follow up of diabetes and problems listed above in the chief complaint.  Diabetes follow up: Her diabetic control is reported as Improved. Tolerating ozempic now at 0.5mg  weekly. Weight is down 8 pounds in 3 months. No AE.  She denies exertional CP or SOB or symptomatic hypoglycemia. She denies foot sores or paresthesias. Due eye exam. No h/o retinopathy HTN: much improved on cardizem CD and Losartan 100. No cp or sob. HLD: tolerating crestor 10 nightly. Due for nonfasting recheck.  C/o bilateral low back pain: burning pain when active. No buttuck or thigh pain. No radiation. No leg weakness. Pain stops once sits down. Will have pain when sneezing or if going over bumpy road in the car. No bowel or bladder dysfunction. No injurya.   Wt Readings from Last 3 Encounters:  02/25/22 (!) 436 lb 6.4 oz (197.9 kg)  11/19/21 (!) 444 lb 9.6 oz (201.7 kg)  10/11/21 (!) 439 lb (199.1 kg)    BP Readings from Last 3 Encounters:  02/25/22 138/82  11/19/21 (!) 148/90  10/11/21 (!) 196/92    Assessment  1. Type 2 diabetes mellitus without complication, without long-term current use of insulin (HCC)   2. Hypertension associated with type 2 diabetes mellitus (HCC)   3. Morbid obesity (HCC)   4. Combined hyperlipidemia associated with type 2 diabetes mellitus (HCC)   5. Need for hepatitis C screening test   6. Chronic bilateral low back pain without sciatica   7. Microalbuminuria due to type 2 diabetes mellitus (HCC)      Plan  Diabetes is currently very well controlled. Continue ozempic and increase dose to 1mg  weekly. Monitor microalbumuria on ARB. Rec eye exam.  HTN is now better controlled.  Continue weight loss and meds. Recheck 3 months.  HLD: recheck lipids on crestor 10. Goal ldl < 70 Obesity: continue with GLP-1 Low back pain: likely musculoskeletal refer to [facility] for further eval and treatment.   Follow up: Return in about 3 months (around 05/28/2022) for complete physical.. Orders Placed This Encounter  Procedures   Comprehensive metabolic panel   Lipid panel   Hepatitis C antibody   Ambulatory referral to Sports Medicine   POCT HgB A1C   Meds ordered this encounter  Medications   Semaglutide, 1 MG/DOSE, 4 MG/3ML SOPN    Sig: Inject 1 mg as directed once a week.    Dispense:  3 mL    Refill:  5      Immunization History  Administered Date(s) Administered   Influenza,inj,Quad PF,6+ Mos 05/05/2019, 10/11/2021   Janssen (J&J) SARS-COV-2 Vaccination 10/15/2019   Tdap 05/05/2019    Diabetes Related Lab Review: Lab Results  Component Value Date   HGBA1C 6.1 (A) 02/25/2022   HGBA1C 6.7 (H) 10/11/2021   HGBA1C 6.8 (H) 05/07/2019    Lab Results  Component Value Date   MICROALBUR 50.5 (H) 11/19/2021   Lab Results  Component Value Date   CREATININE 1.12 10/11/2021   BUN 14 10/11/2021   NA 144 10/11/2021   K 4.4 10/11/2021   CL 105 10/11/2021   CO2 30 10/11/2021   Lab Results  Component Value Date  CHOL 140 10/11/2021   CHOL 155 05/05/2019   Lab Results  Component Value Date   HDL 39.10 10/11/2021   HDL 41.60 05/05/2019   Lab Results  Component Value Date   LDLCALC 83 10/11/2021   LDLCALC 92 05/05/2019   Lab Results  Component Value Date   TRIG 87.0 10/11/2021   TRIG 107.0 05/05/2019   Lab Results  Component Value Date   CHOLHDL 4 10/11/2021   CHOLHDL 4 05/05/2019   No results found for: "LDLDIRECT" The 10-year ASCVD risk score (Arnett DK, et al., 2019) is: 9.2%   Values used to calculate the score:     Age: 29 years     Sex: Female     Is Non-Hispanic African American: Yes     Diabetic: Yes     Tobacco smoker: No     Systolic  Blood Pressure: 138 mmHg     Is BP treated: Yes     HDL Cholesterol: 39.1 mg/dL     Total Cholesterol: 140 mg/dL I have reviewed the PMH, Fam and Soc history. Patient Active Problem List   Diagnosis Date Noted   Microalbuminuria due to type 2 diabetes mellitus (HCC) 02/25/2022   Chronic bilateral low back pain without sciatica 02/25/2022   Type 2 diabetes mellitus without complication, without long-term current use of insulin (HCC) 11/19/2021   Combined hyperlipidemia associated with type 2 diabetes mellitus (HCC) 11/19/2021   CSF leak from nose 02/17/2019    S/p repair due to meningocoele. [facility]. Resolve d2020    Encephalocele (HCC) 02/17/2019   OSA (obstructive sleep apnea) 02/17/2019   Hypertension associated with type 2 diabetes mellitus (HCC) 12/05/2018   Morbid obesity (HCC) 11/26/2018   History of Roux-en-Y gastric bypass nov 2010 02/22/2011    Surgery date: 05/22/09     Social History: Patient  reports that she has never smoked. She has never used smokeless tobacco. She reports current alcohol use. She reports that she does not use drugs.  Review of Systems: Ophthalmic: negative for eye pain, loss of vision or double vision Cardiovascular: negative for chest pain Respiratory: negative for SOB or persistent cough Gastrointestinal: negative for abdominal pain Genitourinary: negative for dysuria or gross hematuria MSK: negative for foot lesions Neurologic: negative for weakness or gait disturbance  Objective  Vitals: BP 138/82   Pulse 80   Temp (!) 97.5 F (36.4 C) (Temporal)   Ht 5\' 5"  (1.651 m)   Wt (!) 436 lb 6.4 oz (197.9 kg)   SpO2 90%   BMI 72.62 kg/m  General: well appearing, no acute distress  Psych:  Alert and oriented, normal mood and affect HEENT:  Normocephalic, atraumatic, moist mucous membranes, supple neck  Cardiovascular:  Nl S1 and S2, RRR with soft systolic murmur, no gallop or rub. no edema Respiratory:  Good breath sounds bilaterally,  CTAB with normal effort, no rales Back: non tender.     Diabetic education: ongoing education regarding chronic disease management for diabetes was given today. We continue to reinforce the ABC's of diabetic management: A1c (<7 or 8 dependent upon patient), tight blood pressure control, and cholesterol management with goal LDL < 100 minimally. We discuss diet strategies, exercise recommendations, medication options and possible side effects. At each visit, we review recommended immunizations and preventive care recommendations for diabetics and stress that good diabetic control can prevent other problems. See below for this patient's data.   Commons side effects, risks, benefits, and alternatives for medications and treatment plan prescribed  today were discussed, and the patient expressed understanding of the given instructions. Patient is instructed to call or message via MyChart if he/she has any questions or concerns regarding our treatment plan. No barriers to understanding were identified. We discussed Red Flag symptoms and signs in detail. Patient expressed understanding regarding what to do in case of urgent or emergency type symptoms.  Medication list was reconciled, printed and provided to the patient in AVS. Patient instructions and summary information was reviewed with the patient as documented in the AVS. This note was prepared with assistance of Dragon voice recognition software. Occasional wrong-word or sound-a-like substitutions may have occurred due to the inherent limitations of voice recognition software  This visit occurred during the SARS-CoV-2 public health emergency.  Safety protocols were in place, including screening questions prior to the visit, additional usage of staff PPE, and extensive cleaning of exam room while observing appropriate contact time as indicated for disinfecting solutions.

## 2022-02-25 NOTE — Patient Instructions (Addendum)
Please return in 3 months for your annual complete physical; please come fasting.   I will release your lab results to you on your MyChart account with further instructions. You may see the results before I do, but when I review them I will send you a message with my report or have my assistant call you if things need to be discussed. Please reply to my message with any questions. Thank you!   Please set up an appointment for a diabetic eye exam and have the results sent to me.   If you have any questions or concerns, please don't hesitate to send me a message via MyChart or call the office at [phone]. Thank you for visiting with Andrea Macias today! It's our pleasure caring for you.

## 2022-02-26 LAB — COMPREHENSIVE METABOLIC PANEL
ALT: 17 U/L (ref 0–35)
AST: 15 U/L (ref 0–37)
Albumin: 4.5 g/dL (ref 3.5–5.2)
Alkaline Phosphatase: 74 U/L (ref 39–117)
BUN: 14 mg/dL (ref 6–23)
CO2: 25 mEq/L (ref 19–32)
Calcium: 9.4 mg/dL (ref 8.4–10.5)
Chloride: 103 mEq/L (ref 96–112)
Creatinine, Ser: 1.32 mg/dL — ABNORMAL HIGH (ref 0.40–1.20)
GFR: 49.93 mL/min — ABNORMAL LOW (ref >60.00)
Glucose, Bld: 95 mg/dL (ref 70–99)
Potassium: 4.1 mEq/L (ref 3.5–5.1)
Sodium: 147 mEq/L — ABNORMAL HIGH (ref 135–145)
Total Bilirubin: 0.3 mg/dL (ref 0.2–1.2)
Total Protein: 8.2 g/dL (ref 6.0–8.3)

## 2022-02-26 LAB — LIPID PANEL
Cholesterol: 111 mg/dL (ref 0–200)
HDL: 35.4 mg/dL — ABNORMAL LOW (ref >39.00)
LDL Cholesterol: 46 mg/dL (ref 0–99)
NonHDL: 75.6
Total CHOL/HDL Ratio: 3
Triglycerides: 148 mg/dL (ref 0.0–149.0)
VLDL: 29.6 mg/dL (ref 0.0–40.0)

## 2022-02-26 LAB — HEPATITIS C ANTIBODY: Hepatitis C Ab: NONREACTIVE

## 2022-03-06 NOTE — Progress Notes (Signed)
Andrea Macias D.[facility] [address] [place] [address] Phone: [phone]   Assessment and Plan:     1. Bilateral hip pain 2. Chronic bilateral low back pain without sciatica 3. Pilonidal cyst -Chronic with exacerbation, initial sports medicine visit - Patient has been experiencing low back pain ever since fall in 06/2021 described as a posterior burning sensation inferior lumbar spine.  Currently unclear etiology of patient's pain.  May be musculoskeletal with contributing factors being MOI, BMI.  Additionally, patient was found to have pilonidal cyst in 05/2021 that is in a similar region to where patient's pain and burning sensation are located. - X-rays obtained in clinic.  My interpretation: No acute fracture, vertebral collapse, dislocation.  Cortical changes most prominent in L5 with mild anterior listhesis L4 and L5.  Mild bilateral degenerative changes in hips. - We will attempt to treat patient's pain as musculoskeletal with course of NSAIDs, HEP, PT.  If patient receives no relief from this treatment, we could consider pilonidal cyst as the underlying cause of patient's discomfort and may be beneficial for her to follow-up with general surgery to discuss surgical options.  Other orders - meloxicam (MOBIC) 15 MG tablet; Take 1 tablet (15 mg total) by mouth daily.    Pertinent previous records reviewed include CT pelvis 06/08/2019   Follow Up: 3 to 4 weeks for reevaluation per above   Subjective:   I, Andrea Macias, am serving as a Neurosurgeon for Andrea Macias  Chief Complaint: low back pain   HPI:   03/13/2022 Patient is a 41 year old female complaining of low back pain . Patient states that she slipped and fell at the gas station her foot got caught in between a poll and her right leg did the split , her bottom hit the floor , after a couple of minutes of walking she feels a burning catching sensation in her hips pelvis,  if she goes to sneeze she has to lean back if she is leaning forward she gets a sharp pain in the back of her hips , speed bumps when she goes over a bouncy bridge if she isnt leaning back she will get the same sharpe pain sensation across the back of her hips/ pelvis , slip happened in December. No numbness or tingling,  was on ib 3xs a day but was stopped do to sodium in her blood , will take two tylenol a day and that doesn't help the pain .  Relevant Historical Information: Bilateral cyst, DM type II, elevated BMI, hypertension  Additional pertinent review of systems negative.   Current Outpatient Medications:    diltiazem (CARDIZEM CD) 360 MG 24 hr capsule, Take 1 capsule (360 mg total) by mouth daily., Disp: 90 capsule, Rfl: 3   losartan (COZAAR) 100 MG tablet, Take 1 tablet (100 mg total) by mouth daily., Disp: 90 tablet, Rfl: 1   meloxicam (MOBIC) 15 MG tablet, Take 1 tablet (15 mg total) by mouth daily., Disp: 30 tablet, Rfl: 0   rosuvastatin (CRESTOR) 10 MG tablet, Take 1 tablet (10 mg total) by mouth daily., Disp: 90 tablet, Rfl: 3   Semaglutide, 1 MG/DOSE, 4 MG/3ML SOPN, Inject 1 mg as directed once a week., Disp: 3 mL, Rfl: 5   Vitamin D, Ergocalciferol, (DRISDOL) 1.25 MG (50000 UNIT) CAPS capsule, Take 50,000 Units by mouth daily., Disp: , Rfl:    Ibuprofen (ADVIL) 200 MG CAPS, Take 3 capsules by mouth daily., Disp: ,  Rfl:    Objective:     Vitals:   03/13/22 0817  Pulse: 97  SpO2: 99%  Weight: (!) 436 lb (197.8 kg)  Height: 5\' 5"  (1.651 m)      Body mass index is 72.55 kg/m.    Physical Exam:    Gen: Appears well, nad, nontoxic and pleasant Psych: Alert and oriented, appropriate mood and affect Neuro: sensation intact, strength is 5/5 in upper and lower extremities, muscle tone wnl Skin: no susupicious lesions or rashes  Back - Normal skin, Spine with normal alignment and no deformity.   TTP superior gluteal cleft, bilateral lumbar paraspinal L3-L5 No tenderness  to vertebral process palpation.     Straight leg raise negative Trendelenberg negative    Electronically signed by:  D.[facility] 11:21 AM 03/13/22

## 2022-03-13 ENCOUNTER — Ambulatory Visit (INDEPENDENT_AMBULATORY_CARE_PROVIDER_SITE_OTHER): Payer: BC Managed Care – PPO

## 2022-03-13 ENCOUNTER — Ambulatory Visit (INDEPENDENT_AMBULATORY_CARE_PROVIDER_SITE_OTHER): Payer: BC Managed Care – PPO | Admitting: Sports Medicine

## 2022-03-13 VITALS — HR 97 | Ht 65.0 in | Wt >= 6400 oz

## 2022-03-13 DIAGNOSIS — L0591 Pilonidal cyst without abscess: Secondary | ICD-10-CM

## 2022-03-13 DIAGNOSIS — M25552 Pain in left hip: Secondary | ICD-10-CM

## 2022-03-13 DIAGNOSIS — M5136 Other intervertebral disc degeneration, lumbar region: Secondary | ICD-10-CM | POA: Diagnosis not present

## 2022-03-13 DIAGNOSIS — M5137 Other intervertebral disc degeneration, lumbosacral region: Secondary | ICD-10-CM | POA: Diagnosis not present

## 2022-03-13 DIAGNOSIS — M25551 Pain in right hip: Secondary | ICD-10-CM

## 2022-03-13 DIAGNOSIS — M545 Low back pain, unspecified: Secondary | ICD-10-CM | POA: Diagnosis not present

## 2022-03-13 DIAGNOSIS — G8929 Other chronic pain: Secondary | ICD-10-CM | POA: Diagnosis not present

## 2022-03-13 MED ORDER — MELOXICAM 15 MG PO TABS: 15.0000 mg | ORAL_TABLET | Freq: Every day | ORAL | 0 refills | Status: DC

## 2022-03-13 NOTE — Patient Instructions (Signed)
Good to see you Start meloxicam 15 mg daily x2 weeks.  If still having pain after 2 weeks, complete 3rd-week of meloxicam. May use remaining meloxicam as needed once daily for pain control.  Do not to use additional NSAIDs while taking meloxicam.  May use Tylenol [phone] mg 2 to 3 times a day for breakthrough pain. Pt referral  Low back HEP  Recommend elliptical stationary bike or aqua-therapy 3-4 week follow up

## 2022-03-14 ENCOUNTER — Other Ambulatory Visit: Payer: Self-pay | Admitting: Family Medicine

## 2022-04-02 ENCOUNTER — Other Ambulatory Visit: Payer: Self-pay | Admitting: Sports Medicine

## 2022-04-02 NOTE — Therapy (Unsigned)
OUTPATIENT PHYSICAL THERAPY  LOWER EXTREMITY/BACK EVALUATION   Patient Name: Andrea Macias MRN: [id number] DOB:October 28, 1980, 41 y.o., female Today's Date: 04/03/2022   PT End of Session - 04/03/22 0853     Visit Number 1    Number of Visits 16    Date for PT Re-Evaluation 05/29/22    Authorization Type BCBS    PT Start Time [phone]    PT Stop Time 0930    PT Time Calculation (min) 40 min    Activity Tolerance Patient tolerated treatment well    Behavior During Therapy [facility] for tasks assessed/performed             Past Medical History:  Diagnosis Date   CSF leak from nose 02/17/2019   S/p repair due to meningocoele. See [facility]. Resolve d2020   Hypertension    Obesity    OSA (obstructive sleep apnea) 02/17/2019   Past Surgical History:  Procedure Laterality Date   ENCEPHALOCELE REPAIR  2020   csf leak, ent at [facility] see records   ROUX-EN-Y PROCEDURE     Patient Active Problem List   Diagnosis Date Noted   Microalbuminuria due to type 2 diabetes mellitus (HCC) 02/25/2022   Chronic bilateral low back pain without sciatica 02/25/2022   Type 2 diabetes mellitus without complication, without long-term current use of insulin (HCC) 11/19/2021   Combined hyperlipidemia associated with type 2 diabetes mellitus (HCC) 11/19/2021   CSF leak from nose 02/17/2019   Encephalocele (HCC) 02/17/2019   OSA (obstructive sleep apnea) 02/17/2019   Hypertension associated with type 2 diabetes mellitus (HCC) 12/05/2018   Morbid obesity (HCC) 11/26/2018   History of Roux-en-Y gastric bypass nov 2010 02/22/2011    PCP: Asencion Partridge, MD  REFERRING PROVIDER: Asencion Partridge, MD   REFERRING DIAG: bilateral hip pain, low back pain  THERAPY DIAG:  Other low back pain  Other abnormalities of gait and mobility  Difficulty in walking, not elsewhere classified  Rationale for Evaluation and Treatment Rehabilitation  ONSET DATE: 2022 Dec.   SUBJECTIVE:   SUBJECTIVE STATEMENT: Had a slip  and fall Dec. 2022. Patient has to lean back to sneeze.  She has to lean forward to go over a speed bump or on uneven road.  She she feels a catch in her back when standing, walking. She can go about 15-20 min and then has to sit.  Pain does not radiate.  Denies red flags.  She has since stopped her travel, has difficulty with basic ADLs and housework.   PERTINENT HISTORY: Rt knee surgery 2017.    03/13/2022 FROM MD NOTE  Patient is a 41 year old female complaining of low back pain . Patient states that she slipped and fell at the gas station her foot got caught in between a poll and her right leg did the split , her bottom hit the floor , after a couple of minutes of walking she feels a burning catching sensation in her hips pelvis, if she goes to sneeze she has to lean back if she is leaning forward she gets a sharp pain in the back of her hips , speed bumps when she goes over a bouncy bridge if she isnt leaning back she will get the same sharpe pain sensation across the back of her hips/ pelvis , slip happened in December. No numbness or tingling,  was on ib 3xs a day but was stopped do to sodium in her blood , will take two tylenol a day and that doesn't  help the pain   PAIN:  Are you having pain? Yes: NPRS scale: none at rest, overall 3/10 Pain location: low back Pain description: sharp, can be gone  Aggravating factors: sneezing, exertion Relieving factors: rest, meds  PRECAUTIONS: None  WEIGHT BEARING RESTRICTIONS No  FALLS:  Has patient fallen in last 6 months? No  LIVING ENVIRONMENT: Lives with: lives alone Lives in: House/apartment Stairs: No Has following equipment at home: Dan Humphreys - 2 wheeled and None Used walker for knee surgery.    OCCUPATION: Works as a Teacher, early years/pre  PLOF: Independent  PATIENT GOALS likes to travel    OBJECTIVE:   DIAGNOSTIC FINDINGS:  XR 8/23 Degenerative disc disease at L3-L4, L4-L5, and L5-S1. 2. Suspected lower lumbar facet hypertrophy. 3.  Grade 1 anterolisthesis of L3 on L4 and L4 on L5, likely degenerative.  PATIENT SURVEYS:  FOTO Hip 49% and back 43% : LESSER GOAL 59%  COGNITION:  Overall cognitive status: Within functional limits for tasks assessed     SENSATION: WFL  POSTURE: rounded shoulders and forward head limited due to body habitus, morbid obesity   PALPATION: TTP across bilateral lumbar region  TRUNK: AROM:  Flexion WFL no pain , relieves typically Extension WFL no pain  Rotation R/L WFL Sidebending R/L. WFL (stretch)    LOWER EXTREMITY ROM:  NT    LOWER EXTREMITY MMT:  MMT Right eval Left eval  Hip flexion 4+ 4+  Hip extension    Hip abduction    Hip adduction    Hip internal rotation    Hip external rotation    Knee flexion 5 5  Knee extension 5 5  Ankle dorsiflexion    Ankle plantarflexion    Ankle inversion    Ankle eversion     (Blank rows = not tested)  LOWER EXTREMITY SPECIAL TESTS:  NT   FUNCTIONAL TESTS:  NT on eval   GAIT: Distance walked: 150 mod I  Assistive device utilized: None Level of assistance: Modified independence Comments: slower pace    TODAY'S TREATMENT: 716 feet, stopped 2 x for rest 6 min walk  HEP for lumbar stability, core  Pt ed regarding XR results, anterolisthesis    PATIENT EDUCATION:  Education details: Anatomy, POC, HEP, core , breathing  Person educated: Patient Education method: Programmer, multimedia, Demonstration, Verbal cues, and Handouts Education comprehension: verbalized understanding, returned demonstration, and needs further education   HOME EXERCISE PROGRAM: Access Code: [email or web address] URL: [email or web address] Date: 04/03/2022 Prepared by: Karie Mainland  Exercises - Supine Transversus Abdominis Bracing - Hands on Stomach  - 1 x daily - 7 x weekly - 2 sets - 10 reps - 10 hold - Supine March  - 1 x daily - 7 x weekly - 2 sets - 10 reps - Supine Transversus Abdominis Bracing with Leg Extension  - 1 x daily - 7 x  weekly - 2 sets - 10 reps - Supine Transversus Abdominis Bracing with Double Leg Fallout  - 1 x daily - 7 x weekly - 2 sets - 10 reps - Supine Lower Trunk Rotation  - 1 x daily - 7 x weekly - 2 sets - 10 reps - 10 hold  ASSESSMENT:  CLINICAL IMPRESSION: Patient is a 41 y.o. female who was seen today for physical therapy evaluation and treatment for low back and hip pain. She presents with symptoms consistent with spondylolisthesis.  Walking 2 min increased back pain and after 6 min walk her pain was increased to 7/10.  She needed 2 rest  breaks, gets anxious when back pain hits.  Sitting reduces pain back to baseline.  She will benefit from skilled PT to provide corrective exercises and education regarding health, core and lifting, posture.     OBJECTIVE IMPAIRMENTS cardiopulmonary status limiting activity, decreased activity tolerance, decreased endurance, decreased mobility, difficulty walking, decreased strength, increased fascial restrictions, improper body mechanics, postural dysfunction, obesity, and pain.   ACTIVITY LIMITATIONS carrying, lifting, standing, squatting, sleeping, and locomotion level  PARTICIPATION LIMITATIONS: cleaning, shopping, community activity, and occupation  [place], Time since onset of injury/illness/exacerbation, and 3+ comorbidities: obesity, HTN, diabetes  are also affecting patient's functional outcome.   REHAB POTENTIAL: Excellent  CLINICAL DECISION MAKING: Stable/uncomplicated  EVALUATION COMPLEXITY: Low   GOALS: Goals reviewed with patient? Yes  SHORT TERM GOALS: Target date: 05/01/2022  Pt will be able to show I with HEP  Baseline:given on eval  Goal status: INITIAL  2.  Pt will be able to report pain in low back with standing , 10 min minimal 4/10 or less  Baseline: 5-7/10 Goal status: INITIAL  3.  Pt will take frequent standing breaks at work in order to improve hip and spine mobility.  Baseline: limited  Goal status:  INITIAL   LONG TERM GOALS: Target date:  05/29/2022   Pt will be able to be able to lift, squat without increased back pain (mod sized items) Baseline:  Goal status: INITIAL  2.  Pt will be able to walk 6 min walk 800 feet with no more than 1 rest break Baseline: 716 2 breaks Goal status: INITIAL  3.  Pt will be able to show I with HEP for long term health and weight mgmt Baseline:  Goal status: INITIAL  4.  Pt will be able to report no pain when driving or riding in a car, going over uneven terrain (speed bump)  Baseline: has to lean forward  Goal status: INITIAL  5.  FOTO score for back with improve to 59% or better to demo improved functional mobility.  Baseline:  Goal status: INITIAL    PLAN: PT FREQUENCY: 2x/week  PT DURATION: 8 weeks  PLANNED INTERVENTIONS: Therapeutic exercises, Therapeutic activity, Neuromuscular re-education, Balance training, Gait training, Patient/Family education, Self Care, Joint mobilization, Aquatic Therapy, Cryotherapy, Moist heat, Manual therapy, and Re-evaluation  PLAN FOR NEXT SESSION: Cardio (nustep wgt limit): sit to stand  standing as tol.  Flexion based core. Aquatics week 2-3    Ordell Prichett, PT 04/03/2022, 2:24 PM

## 2022-04-03 ENCOUNTER — Ambulatory Visit: Payer: BC Managed Care – PPO | Attending: Family Medicine | Admitting: Physical Therapy

## 2022-04-03 ENCOUNTER — Encounter: Payer: Self-pay | Admitting: Physical Therapy

## 2022-04-03 DIAGNOSIS — R262 Difficulty in walking, not elsewhere classified: Secondary | ICD-10-CM | POA: Diagnosis present

## 2022-04-03 DIAGNOSIS — R2689 Other abnormalities of gait and mobility: Secondary | ICD-10-CM | POA: Diagnosis present

## 2022-04-03 DIAGNOSIS — M545 Low back pain, unspecified: Secondary | ICD-10-CM | POA: Insufficient documentation

## 2022-04-03 DIAGNOSIS — G8929 Other chronic pain: Secondary | ICD-10-CM | POA: Insufficient documentation

## 2022-04-03 DIAGNOSIS — M25552 Pain in left hip: Secondary | ICD-10-CM | POA: Diagnosis not present

## 2022-04-03 DIAGNOSIS — M5459 Other low back pain: Secondary | ICD-10-CM | POA: Insufficient documentation

## 2022-04-03 DIAGNOSIS — M25551 Pain in right hip: Secondary | ICD-10-CM | POA: Insufficient documentation

## 2022-04-04 NOTE — Progress Notes (Unsigned)
    Andrea Macias [address] [place] [address] Phone: [phone]   Assessment and Plan:     There are no diagnoses linked to this encounter.  ***   Pertinent previous records reviewed include ***   Follow Up: ***     Subjective:   I, Andrea Macias, am serving as a Neurosurgeon for Doctor Richardean Sale   Chief Complaint: low back pain    HPI:    03/13/2022 Patient is a 41 year old female complaining of low back pain . Patient states that she slipped and fell at the gas station her foot got caught in between a poll and her right leg did the split , her bottom hit the floor , after a couple of minutes of walking she feels a burning catching sensation in her hips pelvis, if she goes to sneeze she has to lean back if she is leaning forward she gets a sharp pain in the back of her hips , speed bumps when she goes over a bouncy bridge if she isnt leaning back she will get the same sharpe pain sensation across the back of her hips/ pelvis , slip happened in December. No numbness or tingling,  was on ib 3xs a day but was stopped do to sodium in her blood , will take two tylenol a day and that doesn't help the pain .  04/10/2022 Patient states   Relevant Historical Information: Bilateral cyst, DM type II, elevated BMI, hypertension  Additional pertinent review of systems negative.   Current Outpatient Medications:    diltiazem (CARDIZEM CD) 360 MG 24 hr capsule, Take 1 capsule (360 mg total) by mouth daily., Disp: 90 capsule, Rfl: 3   Ibuprofen (ADVIL) 200 MG CAPS, Take 3 capsules by mouth daily. (Patient not taking: Reported on 04/03/2022), Disp: , Rfl:    losartan (COZAAR) 100 MG tablet, TAKE 1 TABLET BY MOUTH EVERY DAY, Disp: 60 tablet, Rfl: 3   meloxicam (MOBIC) 15 MG tablet, Take 1 tablet (15 mg total) by mouth daily., Disp: 30 tablet, Rfl: 0   rosuvastatin (CRESTOR) 10 MG tablet, Take 1 tablet (10 mg total) by mouth daily.,  Disp: 90 tablet, Rfl: 3   Semaglutide, 1 MG/DOSE, 4 MG/3ML SOPN, Inject 1 mg as directed once a week., Disp: 3 mL, Rfl: 5   Vitamin D, Ergocalciferol, (DRISDOL) 1.25 MG (50000 UNIT) CAPS capsule, Take 50,000 Units by mouth daily., Disp: , Rfl:    Objective:     There were no vitals filed for this visit.    There is no height or weight on file to calculate BMI.    Physical Exam:    ***   Electronically signed by:  Andrea Macias 10:25 AM 04/04/22

## 2022-04-06 NOTE — Therapy (Signed)
OUTPATIENT PHYSICAL THERAPY TREATMENT NOTE   Patient Name: Andrea Macias MRN: [id number] DOB:June 01, 1981, 41 y.o., female Today's Date: 04/09/2022  PCP: Billey Chang, MD  REFERRING PROVIDER: Billey Chang, MD   END OF SESSION:   PT End of Session - 04/09/22 0856     Visit Number 2    Number of Visits 16    Date for PT Re-Evaluation 05/29/22    Authorization Type BCBS    PT Start Time 0915    PT Stop Time 0955    PT Time Calculation (min) 40 min    Activity Tolerance Patient tolerated treatment well    Behavior During Therapy [facility] for tasks assessed/performed             Past Medical History:  Diagnosis Date   CSF leak from nose 02/17/2019   S/p repair due to meningocoele. [facility]. Resolve d2020   Hypertension    Obesity    OSA (obstructive sleep apnea) 02/17/2019   Past Surgical History:  Procedure Laterality Date   ENCEPHALOCELE REPAIR  2020   csf leak, ent at [facility] see records   ROUX-EN-Y PROCEDURE     Patient Active Problem List   Diagnosis Date Noted   Microalbuminuria due to type 2 diabetes mellitus ([place]) 02/25/2022   Chronic bilateral low back pain without sciatica 02/25/2022   Type 2 diabetes mellitus without complication, without long-term current use of insulin ([place]) 11/19/2021   Combined hyperlipidemia associated with type 2 diabetes mellitus ([place]) 11/19/2021   CSF leak from nose 02/17/2019   Encephalocele ([place]) 02/17/2019   OSA (obstructive sleep apnea) 02/17/2019   Hypertension associated with type 2 diabetes mellitus ([place]) 12/05/2018   Morbid obesity ([place]) 11/26/2018   History of Roux-en-Y gastric bypass nov 2010 02/22/2011    REFERRING DIAG: bilateral hip pain, low back pain  THERAPY DIAG:  Other low back pain  Other abnormalities of gait and mobility  Difficulty in walking, not elsewhere classified  Rationale for Evaluation and Treatment Rehabilitation  PERTINENT HISTORY: Rt knee surgery 2017.     03/13/2022 FROM MD NOTE   Patient is a 41 year old female complaining of low back pain . Patient states that she slipped and fell at the gas station her foot got caught in between a poll and her right leg did the split , her bottom hit the floor , after a couple of minutes of walking she feels a burning catching sensation in her hips pelvis, if she goes to sneeze she has to lean back if she is leaning forward she gets a sharp pain in the back of her hips , speed bumps when she goes over a bouncy bridge if she isnt leaning back she will get the same sharpe pain sensation across the back of her hips/ pelvis , slip happened in December. No numbness or tingling,  was on ib 3xs a day but was stopped do to sodium in her blood , will take two tylenol a day and that doesn't help the pain   PRECAUTIONS: None  ONSET DATE: 2022 Dec.   SUBJECTIVE: Patient reports that she has been feeling ok and doing her exercises daily.   PAIN:  Are you having pain? Yes: NPRS scale: none at rest, overall 3/10 Pain location: low back Pain description: sharp, can be gone  Aggravating factors: sneezing, exertion Relieving factors: rest, meds   OBJECTIVE: (objective measures completed at initial evaluation unless otherwise dated)   DIAGNOSTIC FINDINGS:  XR 8/23 Degenerative disc disease at  L3-L4, L4-L5, and L5-S1. 2. Suspected lower lumbar facet hypertrophy. 3. Grade 1 anterolisthesis of L3 on L4 and L4 on L5, likely degenerative.   PATIENT SURVEYS:  FOTO Hip 49% and back 43% : LESSER GOAL 59%   COGNITION:           Overall cognitive status: Within functional limits for tasks assessed                          SENSATION: WFL   POSTURE: rounded shoulders and forward head limited due to body habitus, morbid obesity    PALPATION: TTP across bilateral lumbar region   TRUNK: AROM:  Flexion WFL no pain , relieves typically Extension WFL no pain  Rotation R/L WFL Sidebending R/L. WFL (stretch)      LOWER EXTREMITY ROM:   NT       LOWER EXTREMITY MMT:   MMT Right eval Left eval  Hip flexion 4+ 4+  Hip extension      Hip abduction      Hip adduction      Hip internal rotation      Hip external rotation      Knee flexion 5 5  Knee extension 5 5  Ankle dorsiflexion      Ankle plantarflexion      Ankle inversion      Ankle eversion       (Blank rows = not tested)   LOWER EXTREMITY SPECIAL TESTS:  NT    FUNCTIONAL TESTS:  NT on eval    GAIT: Distance walked: 150 mod I  Assistive device utilized: None Level of assistance: Modified independence Comments: slower pace      TODAY'S TREATMENT: [facility] Adult PT Treatment:                                                DATE: 04/06/2022 Therapeutic Exercise: Nustep level 5 x 5 mins Pball roll outs forward/lateral x10 each Supine marching 2x10 BIL Supine alternating clamshells 2x10 BIL Bridges 2x10 PPT 5" hold 2x10 Supine hip adduction ball squeeze 5" hold 2x10 LTR x10 BIL STS 2x10 arms crossed   04/03/2022: 716 feet, stopped 2 x for rest 6 min walk  HEP for lumbar stability, core  Pt ed regarding XR results, anterolisthesis      PATIENT EDUCATION:  Education details: Anatomy, POC, HEP, core , breathing  Person educated: Patient Education method: Consulting civil engineer, Demonstration, Verbal cues, and Handouts Education comprehension: verbalized understanding, returned demonstration, and needs further education     HOME EXERCISE PROGRAM: Access Code: [email or web address] URL: [email or web address] Date: 04/03/2022 Prepared by: Raeford Razor   Exercises - Supine Transversus Abdominis Bracing - Hands on Stomach  - 1 x daily - 7 x weekly - 2 sets - 10 reps - 10 hold - Supine March  - 1 x daily - 7 x weekly - 2 sets - 10 reps - Supine Transversus Abdominis Bracing with Leg Extension  - 1 x daily - 7 x weekly - 2 sets - 10 reps - Supine Transversus Abdominis Bracing with Double Leg Fallout  - 1 x daily - 7 x weekly - 2 sets - 10 reps - Supine Lower Trunk  Rotation  - 1 x daily - 7 x weekly - 2 sets - 10 reps - 10 hold   ASSESSMENT:   CLINICAL  IMPRESSION: Patient presents to PT with continued reports of lower back pain and reports daily HEP compliance. Session today focused on improving activity tolerance, core and proximal hip strengthening and lumbar mobility. Patient was able to tolerate all prescribed exercises with no adverse effects and did not have an increase in pain throughout session. Plan to incorporate more standing exercises as tolerated. Patient continues to benefit from skilled PT services and should be progressed as able to improve functional independence.     OBJECTIVE IMPAIRMENTS cardiopulmonary status limiting activity, decreased activity tolerance, decreased endurance, decreased mobility, difficulty walking, decreased strength, increased fascial restrictions, improper body mechanics, postural dysfunction, obesity, and pain.    ACTIVITY LIMITATIONS carrying, lifting, standing, squatting, sleeping, and locomotion level   PARTICIPATION LIMITATIONS: cleaning, shopping, community activity, and occupation   [place], Time since onset of injury/illness/exacerbation, and 3+ comorbidities: obesity, HTN, diabetes  are also affecting patient's functional outcome.    REHAB POTENTIAL: Excellent   CLINICAL DECISION MAKING: Stable/uncomplicated   EVALUATION COMPLEXITY: Low     GOALS: Goals reviewed with patient? Yes   SHORT TERM GOALS: Target date: 05/01/2022  Pt will be able to show I with HEP  Baseline:given on eval  Goal status: INITIAL   2.  Pt will be able to report pain in low back with standing , 10 min minimal 4/10 or less  Baseline: 5-7/10 Goal status: INITIAL   3.  Pt will take frequent standing breaks at work in order to improve hip and spine mobility.  Baseline: limited  Goal status: INITIAL     LONG TERM GOALS: Target date:  05/29/2022    Pt will be able to be able to lift, squat without  increased back pain (mod sized items) Baseline:  Goal status: INITIAL   2.  Pt will be able to walk 6 min walk 800 feet with no more than 1 rest break Baseline: 716 2 breaks Goal status: INITIAL   3.  Pt will be able to show I with HEP for long term health and weight mgmt Baseline:  Goal status: INITIAL   4.  Pt will be able to report no pain when driving or riding in a car, going over uneven terrain (speed bump)  Baseline: has to lean forward  Goal status: INITIAL   5.  FOTO score for back with improve to 59% or better to demo improved functional mobility.  Baseline:  Goal status: INITIAL       PLAN: PT FREQUENCY: 2x/week   PT DURATION: 8 weeks   PLANNED INTERVENTIONS: Therapeutic exercises, Therapeutic activity, Neuromuscular re-education, Balance training, Gait training, Patient/Family education, Self Care, Joint mobilization, Aquatic Therapy, Cryotherapy, Moist heat, Manual therapy, and Re-evaluation   PLAN FOR NEXT SESSION: Cardio (nustep wgt limit): sit to stand  standing as tol.  Flexion based core. Aquatics week 2-3     Margarette Canada, PTA 04/09/2022, 9:59 AM

## 2022-04-09 ENCOUNTER — Ambulatory Visit: Payer: BC Managed Care – PPO

## 2022-04-09 DIAGNOSIS — R2689 Other abnormalities of gait and mobility: Secondary | ICD-10-CM

## 2022-04-09 DIAGNOSIS — M5459 Other low back pain: Secondary | ICD-10-CM

## 2022-04-09 DIAGNOSIS — R262 Difficulty in walking, not elsewhere classified: Secondary | ICD-10-CM

## 2022-04-09 NOTE — Patient Instructions (Signed)
Aquatic Therapy at [facility]-  What to Expect!  Where:   [facility] [address] [place], [place] [address] Rehab phone [phone]  NOTE:  You will receive an automated phone message reminding you of your appt and it will say the appointment is at the [address] [facility] [address] [facility] clinic.          How to Prepare: Please make sure you drink 8 ounces of water about one hour prior to your pool session A caregiver may attend if needed with the patient to help assist as needed. A caregiver can sit in the pool room on chair. Please arrive IN YOUR SUIT and 15 minutes prior to your appointment - this helps to avoid delays in starting your session. Please make sure to attend to any toileting needs prior to entering the pool Locker rooms for changing are provided.   There is direct access to the pool deck form the locker room.  You can lock your belongings in a locker with lock provided. Once on the pool deck your therapist will ask if you have signed the Patient  Consent and Assignment of Benefits form before beginning treatment Your therapist may take your blood pressure prior to, during and after your session if indicated We usually try and create a home exercise program based on activities we do in the pool.  Please be thinking about who might be able to assist you in the pool should you need to participate in an aquatic home exercise program at the time of discharge if you need assistance.  Some patients do not want to or do not have the ability to participate in an aquatic home program - this is not a barrier in any way to you participating in aquatic therapy as part of your current therapy plan! After Discharge from PT, you can continue using home program at  the [facility]/, there is a drop-in fee for $5 ($45 a month)or for 60 years  or older $4.00 ($40 a month for seniors ) or any local [employer] pool.  Memberships for purchase are  available for gym/pool at [facility]  IT IS VERY IMPORTANT THAT YOUR LAST VISIT BE IN THE CLINIC AT [address] AFTER YOUR LAST AQUATIC VISIT.  PLEASE MAKE SURE THAT YOU HAVE A [address]  APPOINTMENT SCHEDULED.   About the pool: Pool is located approximately 500 FT from the entrance of the building.  Please bring a support person if you need assistance traveling this      distance.   Your therapist will assist you in entering the water; there are two ways to           enter: stairs with railings, and a mechanical lift. Your therapist will determine the most appropriate way for you.  Water temperature is usually between 88-90 degrees  There may be up to 2 other swimmers in the pool at the same time  The pool deck is tile, please wear shoes with good traction if you prefer not to be barefoot.    Contact Info:  For appointment scheduling and cancellations:         Please call the [facility]  PH:[phone]              Aquatic Therapy  Outpatient Rehabilitation @ [facility]       All sessions are 45 minutes                                                    

## 2022-04-10 ENCOUNTER — Ambulatory Visit (INDEPENDENT_AMBULATORY_CARE_PROVIDER_SITE_OTHER): Payer: BC Managed Care – PPO | Admitting: Sports Medicine

## 2022-04-10 ENCOUNTER — Other Ambulatory Visit: Payer: Self-pay | Admitting: Sports Medicine

## 2022-04-10 VITALS — HR 84 | Ht 65.0 in | Wt >= 6400 oz

## 2022-04-10 DIAGNOSIS — M545 Low back pain, unspecified: Secondary | ICD-10-CM | POA: Diagnosis not present

## 2022-04-10 DIAGNOSIS — M25552 Pain in left hip: Secondary | ICD-10-CM | POA: Diagnosis not present

## 2022-04-10 DIAGNOSIS — G8929 Other chronic pain: Secondary | ICD-10-CM

## 2022-04-10 DIAGNOSIS — M25551 Pain in right hip: Secondary | ICD-10-CM

## 2022-04-10 NOTE — Patient Instructions (Signed)
Good to see you  Lumbar MRI ,if MRI is declined continue PT for 2-3 weeks then call us back and let us know if you want to follow through with MRI at that time  Follow up 3 days after

## 2022-04-11 ENCOUNTER — Encounter: Payer: Self-pay | Admitting: Sports Medicine

## 2022-04-12 ENCOUNTER — Ambulatory Visit: Payer: BC Managed Care – PPO

## 2022-04-12 NOTE — Addendum Note (Signed)
Addended by: Pollyann Glen on: 04/12/2022 12:27 PM   Modules accepted: Orders

## 2022-04-15 ENCOUNTER — Encounter: Payer: Self-pay | Admitting: *Deleted

## 2022-04-15 NOTE — Therapy (Unsigned)
OUTPATIENT PHYSICAL THERAPY TREATMENT NOTE   Patient Name: Andrea Macias MRN: [id number] DOB:[birth year], 41 y.o., female Today's Date: 04/16/2022  PCP: Billey Chang, MD  REFERRING PROVIDER: Billey Chang, MD   END OF SESSION:   PT End of Session - 04/16/22 0845     Visit Number 3    Number of Visits 16    Date for PT Re-Evaluation 05/29/22    Authorization Type BCBS    PT Start Time 0845    PT Stop Time 0930    PT Time Calculation (min) 45 min              Past Medical History:  Diagnosis Date   CSF leak from nose 02/17/2019   S/p [facility] due to [facility]ocoele. [facility]. Resolve d2020   Hypertension    Obesity    OSA (obstructive sleep apnea) 02/17/2019   Past Surgical History:  Procedure Laterality Date   ENCEPHALOCELE REPAIR  2020   csf leak, ent [facility] see records   ROUX-EN-Y PROCEDURE     Patient Active Problem List   Diagnosis Date Noted   Microalbuminuria due to type 2 diabetes mellitus ([place]) 02/25/2022   Chronic bilateral low back pain without sciatica 02/25/2022   Type 2 diabetes mellitus without complication, without long-term current use of insulin ([place]) 11/19/2021   Combined hyperlipidemia associated with type 2 diabetes mellitus ([place]) 11/19/2021   CSF leak from nose 02/17/2019   Encephalocele ([place]) 02/17/2019   OSA (obstructive sleep apnea) 02/17/2019   Hypertension associated with type 2 diabetes mellitus ([place]) 12/05/2018   Morbid obesity ([place]) 11/26/2018   History of Roux-en-Y gastric bypass nov 2010 02/22/2011    REFERRING DIAG: bilateral hip pain, low back pain  THERAPY DIAG:  Other low back pain  Other abnormalities of gait and mobility  Difficulty in walking, not elsewhere classified  Rationale for Evaluation and Treatment Rehabilitation  PERTINENT HISTORY: Rt knee surgery 2017.     PRECAUTIONS: None  ONSET DATE: 2022 Dec.   SUBJECTIVE: 2/10 pain today.  Stiff in back, sore in pelvis.   PAIN:  Are you having pain? Yes:  NPRS scale: none at rest, overall 2/10 Pain location: low back Pain description: sharp, can be gone  Aggravating factors: sneezing, exertion Relieving factors: rest, meds   OBJECTIVE: (objective measures completed at initial evaluation unless otherwise dated)   DIAGNOSTIC FINDINGS:  XR 8/23 Degenerative disc disease at L3-L4, L4-L5, and L5-S1. 2. Suspected lower lumbar facet hypertrophy. 3. Grade 1 anterolisthesis of L3 on L4 and L4 on L5, likely degenerative.   PATIENT SURVEYS:  FOTO Hip 49% and back 43% : LESSER GOAL 59%   COGNITION:           Overall cognitive status: Within functional limits for tasks assessed                          SENSATION: WFL   POSTURE: rounded shoulders and forward head limited due to body habitus, morbid obesity    PALPATION: TTP across bilateral lumbar region   TRUNK: AROM:  Flexion WFL no pain , relieves typically Extension WFL no pain  Rotation R/L WFL Sidebending R/L. WFL (stretch)      LOWER EXTREMITY ROM:   NT      LOWER EXTREMITY MMT:   MMT Right eval Left eval  Hip flexion 4+ 4+  Hip extension      Hip abduction      Hip adduction  Hip internal rotation      Hip external rotation      Knee flexion 5 5  Knee extension 5 5  Ankle dorsiflexion      Ankle plantarflexion      Ankle inversion      Ankle eversion       (Blank rows = not tested)   LOWER EXTREMITY SPECIAL TESTS:  NT    FUNCTIONAL TESTS:  NT on eval    GAIT: Distance walked: 150 mod I  Assistive device utilized: None Level of assistance: Modified independence Comments: slower pace      TODAY'S TREATMENT:  [facility] Adult PT Treatment:                                                DATE: 04/16/22 Therapeutic Exercise: Seated Pilates ring press with core activation  x 10  Seated upper trunk rotation x 5 each side  Seated pelvic tilt x 10  Seated marching holding Pilates ring x 10 Sit to stand with ring x 10  Standing core press with ball ,  exhale with pushing bilateral UEs  L stretch at bar  Standing springboard row and extension x 15 with core/exhale  Sidefacing Palloff press and rotation  x 15 each with [place]  Supine oblique ball press x 5 each side  Supine bridging with ball  x 15  Heel slides with ball x 15  Lower trunk rotation x 10 small ROM  HEP review stabilization x 10 each : march, heel slide and clam with green band      [facility] Adult PT Treatment:                                                DATE: 04/06/2022 Therapeutic Exercise: Nustep level 5 x 5 mins Pball roll outs forward/lateral x10 each Supine marching 2x10 BIL Supine alternating clamshells 2x10 BIL Bridges 2x10 PPT 5" hold 2x10 Supine hip adduction ball squeeze 5" hold 2x10 LTR x10 BIL STS 2x10 arms crossed   04/03/2022: 716 feet, stopped 2 x for rest 6 min walk  HEP for lumbar stability, core  Pt ed regarding XR results, anterolisthesis      PATIENT EDUCATION:  Education details: Anatomy, POC, HEP, core , breathing  Person educated: Patient Education method: Consulting civil engineer, Demonstration, Verbal cues, and Handouts Education comprehension: verbalized understanding, returned demonstration, and needs further education     HOME EXERCISE PROGRAM: Access Code: [email or web address] URL: [email or web address] Date: 04/16/2022 Prepared by: Raeford Razor  Exercises - Supine Transversus Abdominis Bracing - Hands on Stomach  - 1 x daily - 7 x weekly - 2 sets - 10 reps - 10 hold - Supine March  - 1 x daily - 7 x weekly - 2 sets - 10 reps - Supine Transversus Abdominis Bracing with Leg Extension  - 1 x daily - 7 x weekly - 2 sets - 10 reps - Supine Transversus Abdominis Bracing with Double Leg Fallout  - 1 x daily - 7 x weekly - 2 sets - 10 reps - Supine Lower Trunk Rotation  - 1 x daily - 7 x weekly - 2 sets - 10 reps - 10 hold - Single Arm Shoulder Extension with Anchored Resistance  -  1 x daily - 7 x weekly - 2 sets - 10 reps - 5 hold -  Standing Bilateral Low Shoulder Row with Anchored Resistance  - 1 x daily - 7 x weekly - 2 sets - 10 reps - 5 hold - Supine Shoulder Horizontal Abduction with Resistance  - 1 x daily - 7 x weekly - 2 sets - 10 reps - 5 hold   ASSESSMENT:   CLINICAL IMPRESSION: Patient doing well overall, able to complete core work in seated, standing and supine positions. She did develop min increase in back pain with standing about 15 min .  She is I with HEP, added on for upper back posture and strength.  She is looking forward to aquatics appt this week.  Cont POC.    OBJECTIVE IMPAIRMENTS cardiopulmonary status limiting activity, decreased activity tolerance, decreased endurance, decreased mobility, difficulty walking, decreased strength, increased fascial restrictions, improper body mechanics, postural dysfunction, obesity, and pain.    ACTIVITY LIMITATIONS carrying, lifting, standing, squatting, sleeping, and locomotion level   PARTICIPATION LIMITATIONS: cleaning, shopping, community activity, and occupation   [place], Time since onset of injury/illness/exacerbation, and 3+ comorbidities: obesity, HTN, diabetes  are also affecting patient's functional outcome.    REHAB POTENTIAL: Excellent   CLINICAL DECISION MAKING: Stable/uncomplicated   EVALUATION COMPLEXITY: Low     GOALS: Goals reviewed with patient? Yes   SHORT TERM GOALS: Target date: 05/01/2022  Pt will be able to show I with HEP  Baseline:given on eval  Goal status: met    2.  Pt will be able to report pain in low back with standing , 10 min minimal 4/10 or less  Baseline: 5-7/10 Goal status: INITIAL   3.  Pt will take frequent standing breaks at work in order to improve hip and spine mobility.  Baseline: limited  Goal status: INITIAL     LONG TERM GOALS: Target date:  05/29/2022    Pt will be able to be able to lift, squat without increased back pain (mod sized items) Baseline:  Goal status: INITIAL   2.   Pt will be able to walk 6 min walk 800 feet with no more than 1 rest break Baseline: 716 2 breaks Goal status: INITIAL   3.  Pt will be able to show I with HEP for long term health and weight mgmt Baseline:  Goal status: INITIAL   4.  Pt will be able to report no pain when driving or riding in a car, going over uneven terrain (speed bump)  Baseline: has to lean forward  Goal status: INITIAL   5.  FOTO score for back with improve to 59% or better to demo improved functional mobility.  Baseline:  Goal status: INITIAL       PLAN: PT FREQUENCY: 2x/week   PT DURATION: 8 weeks   PLANNED INTERVENTIONS: Therapeutic exercises, Therapeutic activity, Neuromuscular re-education, Balance training, Gait training, Patient/Family education, Self Care, Joint mobilization, Aquatic Therapy, Cryotherapy, Moist heat, Manual therapy, and Re-evaluation   PLAN FOR NEXT SESSION:no nustep due to wgt. : sit to stand  standing as tol.  Flexion based core. Aquatics week 2-3     Willie Loy, PT 04/16/2022, 8:45 AM  Raeford Razor, PT 04/16/22 9:28 AM Phone: [phone] Fax: [phone]

## 2022-04-16 ENCOUNTER — Encounter: Payer: Self-pay | Admitting: Physical Therapy

## 2022-04-16 ENCOUNTER — Ambulatory Visit: Payer: BC Managed Care – PPO | Admitting: Physical Therapy

## 2022-04-16 DIAGNOSIS — R2689 Other abnormalities of gait and mobility: Secondary | ICD-10-CM

## 2022-04-16 DIAGNOSIS — M5459 Other low back pain: Secondary | ICD-10-CM

## 2022-04-16 DIAGNOSIS — R262 Difficulty in walking, not elsewhere classified: Secondary | ICD-10-CM

## 2022-04-18 ENCOUNTER — Ambulatory Visit: Payer: BC Managed Care – PPO | Admitting: Physical Therapy

## 2022-04-18 NOTE — Therapy (Deleted)
OUTPATIENT PHYSICAL THERAPY TREATMENT NOTE   Patient Name: Andrea Macias MRN: [id number] DOB:Oct 13, 1980, 41 y.o., female Today's Date: 04/18/2022  PCP: Billey Chang, MD  REFERRING PROVIDER: Billey Chang, MD   END OF SESSION:      Past Medical History:  Diagnosis Date   CSF leak from nose 02/17/2019   S/p repair due to meningocoele. See [facility]. Resolve d2020   Hypertension    Obesity    OSA (obstructive sleep apnea) 02/17/2019   Past Surgical History:  Procedure Laterality Date   ENCEPHALOCELE REPAIR  2020   csf leak, ent at [facility] see records   ROUX-EN-Y PROCEDURE     Patient Active Problem List   Diagnosis Date Noted   Microalbuminuria due to type 2 diabetes mellitus ([place]) 02/25/2022   Chronic bilateral low back pain without sciatica 02/25/2022   Type 2 diabetes mellitus without complication, without long-term current use of insulin ([place]) 11/19/2021   Combined hyperlipidemia associated with type 2 diabetes mellitus ([place]) 11/19/2021   CSF leak from nose 02/17/2019   Encephalocele ([place]) 02/17/2019   OSA (obstructive sleep apnea) 02/17/2019   Hypertension associated with type 2 diabetes mellitus ([place]) 12/05/2018   Morbid obesity ([place]) 11/26/2018   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  Rationale for Evaluation and Treatment Rehabilitation  PERTINENT HISTORY: Rt knee surgery 2017.     PRECAUTIONS: None  ONSET DATE: 2022 Dec.   SUBJECTIVE: ***  PAIN:  Are you having pain? Yes: NPRS scale: none at rest, overall ***/10 Pain location: low back Pain description: sharp, can be gone  Aggravating factors: sneezing, exertion Relieving factors: rest, meds   OBJECTIVE: (objective measures completed at initial evaluation unless otherwise dated)   DIAGNOSTIC FINDINGS:  XR 8/23 Degenerative disc disease at L3-L4, L4-L5, and L5-S1. 2. Suspected lower  lumbar facet hypertrophy. 3. Grade 1 anterolisthesis of L3 on L4 and L4 on L5, likely degenerative.   PATIENT SURVEYS:  FOTO Hip 49% and back 43% : LESSER GOAL 59%   COGNITION:           Overall cognitive status: Within functional limits for tasks assessed                          SENSATION: WFL   POSTURE: rounded shoulders and forward head limited due to body habitus, morbid obesity    PALPATION: TTP across bilateral lumbar region   TRUNK: AROM:  Flexion WFL no pain , relieves typically Extension WFL no pain  Rotation R/L WFL Sidebending R/L. WFL (stretch)      LOWER EXTREMITY ROM:   NT      LOWER EXTREMITY MMT:   MMT Right eval Left eval  Hip flexion 4+ 4+  Hip extension      Hip abduction      Hip adduction      Hip internal rotation      Hip external rotation      Knee flexion 5 5  Knee extension 5 5  Ankle dorsiflexion      Ankle plantarflexion      Ankle inversion      Ankle eversion       (Blank rows = not tested)   LOWER EXTREMITY SPECIAL TESTS:  NT    FUNCTIONAL TESTS:  NT on eval    GAIT: Distance walked: 150 mod I  Assistive device  utilized: None Level of assistance: Modified independence Comments: slower pace      TODAY'S TREATMENT:  TREATMENT 04/18/22:  Aquatic therapy at [address] - therapeutic pool temp 92 degrees Pt enters building independently.  Treatment took place in water 3.8 to  4 ft 8 in.feet deep depending upon activity.  Pt entered and exited the pool via stair and handrails   Pt pain level *** at initiation of water walking.   Aquatic Therapy:  Water walking for warm up  Stretching: Runners stretch on bottom step x30" BIL Hamstring stretch on bottom step x30" BIL Figure 4 squat stretch, BIL UE support 2x30" BIL  At edge of pool, pt performed LE exercise: Standing march Hamstring curl x20 BIL Marching hip flexion to knee extension 2x10 BIL Hip abd/add x20 BIL Hip Circles CC/CCW 2x10 each  BIL Hip ext/flex with knee straight x 20 BIL Squats 2x20 Lunge fwd Lunge lateral Step ups on submerged step 2x10 BIL Step up and overs on submerged step 2x10 BIL  Sitting on bench in water: Bicycle kicks x1' Reverse bicycle kicks x1' Flutter kicks x1' Scissor kicks x1' Kickboard push/pull x1' Kickboard push downs x1'   Pt requires the buoyancy of water for active assisted exercises with buoyancy supported for strengthening and AROM exercises. Hydrostatic pressure also supports joints by unweighting joint load by at least 50 % in 3-4 feet depth water. 80% in chest to neck deep water. Water will provide assistance with movement using the current and laminar flow while the buoyancy reduces weight bearing. Pt requires the viscosity of the water for resistance with strengthening exercises.   [facility] Adult PT Treatment:                                                DATE: 04/16/22 Therapeutic Exercise: Seated Pilates ring press with core activation  x 10  Seated upper trunk rotation x 5 each side  Seated pelvic tilt x 10  Seated marching holding Pilates ring x 10 Sit to stand with ring x 10  Standing core press with ball , exhale with pushing bilateral UEs  L stretch at bar  Standing springboard row and extension x 15 with core/exhale  Sidefacing Palloff press and rotation  x 15 each with [place]  Supine oblique ball press x 5 each side  Supine bridging with ball  x 15  Heel slides with ball x 15  Lower trunk rotation x 10 small ROM  HEP review stabilization x 10 each : march, heel slide and clam with green band      [facility] Adult PT Treatment:                                                DATE: 04/06/2022 Therapeutic Exercise: Nustep level 5 x 5 mins Pball roll outs forward/lateral x10 each Supine marching 2x10 BIL Supine alternating clamshells 2x10 BIL Bridges 2x10 PPT 5" hold 2x10 Supine hip adduction ball squeeze 5" hold 2x10 LTR x10 BIL STS 2x10 arms  crossed   04/03/2022: 716 feet, stopped 2 x for rest 6 min walk  HEP for lumbar stability, core  Pt ed regarding XR results, anterolisthesis      PATIENT EDUCATION:  Education details: Anatomy, POC, HEP, core , breathing  Person educated: Patient Education method: Explanation, Demonstration, Verbal cues, and Handouts Education comprehension: verbalized understanding, returned demonstration, and needs further education     HOME EXERCISE PROGRAM: Access Code: [email or web address] URL: [email or web address] Date: 04/16/2022 Prepared by: Raeford Razor  Exercises - Supine Transversus Abdominis Bracing - Hands on Stomach  - 1 x daily - 7 x weekly - 2 sets - 10 reps - 10 hold - Supine March  - 1 x daily - 7 x weekly - 2 sets - 10 reps - Supine Transversus Abdominis Bracing with Leg Extension  - 1 x daily - 7 x weekly - 2 sets - 10 reps - Supine Transversus Abdominis Bracing with Double Leg Fallout  - 1 x daily - 7 x weekly - 2 sets - 10 reps - Supine Lower Trunk Rotation  - 1 x daily - 7 x weekly - 2 sets - 10 reps - 10 hold - Single Arm Shoulder Extension with Anchored Resistance  - 1 x daily - 7 x weekly - 2 sets - 10 reps - 5 hold - Standing Bilateral Low Shoulder Row with Anchored Resistance  - 1 x daily - 7 x weekly - 2 sets - 10 reps - 5 hold - Supine Shoulder Horizontal Abduction with Resistance  - 1 x daily - 7 x weekly - 2 sets - 10 reps - 5 hold   ASSESSMENT:   CLINICAL IMPRESSION: Session today focused on core and proximal hip strengthening in the aquatic environment for use of buoyancy to offload joints and the viscosity of water as resistance during therapeutic exercise.  Patient was able to tolerate all prescribed exercises in the aquatic environment with no adverse effects and reports ***/10 pain at the end of the session. Patient continues to benefit from skilled PT services on land and aquatic based and should be progressed as able to improve functional independence.      OBJECTIVE IMPAIRMENTS cardiopulmonary status limiting activity, decreased activity tolerance, decreased endurance, decreased mobility, difficulty walking, decreased strength, increased fascial restrictions, improper body mechanics, postural dysfunction, obesity, and pain.    ACTIVITY LIMITATIONS carrying, lifting, standing, squatting, sleeping, and locomotion level   PARTICIPATION LIMITATIONS: cleaning, shopping, community activity, and occupation   [place], Time since onset of injury/illness/exacerbation, and 3+ comorbidities: obesity, HTN, diabetes  are also affecting patient's functional outcome.    REHAB POTENTIAL: Excellent   CLINICAL DECISION MAKING: Stable/uncomplicated   EVALUATION COMPLEXITY: Low     GOALS: Goals reviewed with patient? Yes   SHORT TERM GOALS: Target date: 05/01/2022  Pt will be able to show I with HEP  Baseline:given on eval  Goal status: met    2.  Pt will be able to report pain in low back with standing , 10 min minimal 4/10 or less  Baseline: 5-7/10 Goal status: INITIAL   3.  Pt will take frequent standing breaks at work in order to improve hip and spine mobility.  Baseline: limited  Goal status: INITIAL     LONG TERM GOALS: Target date:  05/29/2022    Pt will be able to be able to lift, squat without increased back pain (mod sized items) Baseline:  Goal status: INITIAL   2.  Pt will be able to walk 6 min walk 800 feet with no more than 1 rest break Baseline: 716 2 breaks Goal status: INITIAL   3.  Pt will be able to show I with HEP for long term  health and weight mgmt Baseline:  Goal status: INITIAL   4.  Pt will be able to report no pain when driving or riding in a car, going over uneven terrain (speed bump)  Baseline: has to lean forward  Goal status: INITIAL   5.  FOTO score for back with improve to 59% or better to demo improved functional mobility.  Baseline:  Goal status: INITIAL       PLAN: PT FREQUENCY:  2x/week   PT DURATION: 8 weeks   PLANNED INTERVENTIONS: Therapeutic exercises, Therapeutic activity, Neuromuscular re-education, Balance training, Gait training, Patient/Family education, Self Care, Joint mobilization, Aquatic Therapy, Cryotherapy, Moist heat, Manual therapy, and Re-evaluation   PLAN FOR NEXT SESSION:no nustep due to wgt. : sit to stand  standing as tol.  Flexion based core. Aquatics week 2-3     Mathis Dad, PT 04/18/2022, 8:02 AM  Raeford Razor, PT 04/18/22 8:02 AM Phone: [phone] Fax: [phone]

## 2022-04-22 NOTE — Therapy (Addendum)
OUTPATIENT PHYSICAL THERAPY TREATMENT NOTE DISCHARGE   Patient Name: Andrea Macias MRN: [id number] DOB:[birth year], 41 y.o., female Today's Date: 04/23/2022  PCP: Billey Chang, MD  REFERRING PROVIDER: Billey Chang, MD   END OF SESSION:   PT End of Session - 04/23/22 0934     Visit Number 4    Number of Visits 16    Date for PT Re-Evaluation 05/29/22    Authorization Type BCBS    PT Start Time 0934    PT Stop Time 1015    PT Time Calculation (min) 41 min    Activity Tolerance Patient tolerated treatment well    Behavior During Therapy [facility] for tasks assessed/performed               Past Medical History:  Diagnosis Date   CSF leak from nose 02/17/2019   S/p repair due to meningocoele. [facility]. Resolve d2020   Hypertension    Obesity    OSA (obstructive sleep apnea) 02/17/2019   Past Surgical History:  Procedure Laterality Date   ENCEPHALOCELE REPAIR  2020   csf leak, ent at [facility] see records   ROUX-EN-Y PROCEDURE     Patient Active Problem List   Diagnosis Date Noted   Microalbuminuria due to type 2 diabetes mellitus ([place]) 02/25/2022   Chronic bilateral low back pain without sciatica 02/25/2022   Type 2 diabetes mellitus without complication, without long-term current use of insulin ([place]) 11/19/2021   Combined hyperlipidemia associated with type 2 diabetes mellitus ([place]) 11/19/2021   CSF leak from nose 02/17/2019   Encephalocele ([place]) 02/17/2019   OSA (obstructive sleep apnea) 02/17/2019   Hypertension associated with type 2 diabetes mellitus (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     PRECAUTIONS:  None  ONSET DATE: 2022 Dec.   SUBJECTIVE: 2/10 pain today.  Just stiff in back.  Going out of town soon.  Doing exercises, like the band.  PAIN:  Are you having pain? Yes: NPRS scale: none at rest, overall 2/10 Pain location: low back Pain description: sharp, can be gone  Aggravating factors: sneezing, exertion Relieving factors: rest, meds   OBJECTIVE: (objective measures completed at initial evaluation unless otherwise dated)   DIAGNOSTIC FINDINGS:  XR 8/23 Degenerative disc disease at L3-L4, L4-L5, and L5-S1. 2. Suspected lower lumbar facet hypertrophy. 3. Grade 1 anterolisthesis of L3 on L4 and L4 on L5, likely degenerative.   PATIENT SURVEYS:  FOTO Hip 49% and back 43% : LESSER GOAL 59%   COGNITION:           Overall cognitive status: Within functional limits for tasks assessed                          SENSATION: WFL   POSTURE: rounded shoulders and forward head limited due to body habitus, morbid obesity    PALPATION: TTP across bilateral lumbar region   TRUNK: AROM:  Flexion WFL no pain , relieves typically Extension WFL no pain  Rotation R/L WFL Sidebending R/L. WFL (stretch)      LOWER EXTREMITY ROM:   NT      LOWER EXTREMITY MMT:  MMT Right eval Left eval  Hip flexion 4+ 4+  Hip extension      Hip abduction      Hip adduction      Hip internal rotation      Hip external rotation      Knee flexion 5 5  Knee extension 5 5  Ankle dorsiflexion      Ankle plantarflexion      Ankle inversion      Ankle eversion       (Blank rows = not tested)   LOWER EXTREMITY SPECIAL TESTS:  NT    FUNCTIONAL TESTS:  NT on eval    GAIT: Distance walked: 150 mod I  Assistive device utilized: None Level of assistance: Modified independence Comments: slower pace      TODAY'S TREATMENT:  [facility] Adult PT Treatment:                                                DATE: 04/23/22 Therapeutic Exercise: Walking intervals:  2 lap, rest 1 min and this was  done x 3 After 2nd trial, gave patient full recovery between due to pain 7/10 3rd trials needed rest break Seated ball roll out for trunk flexion  Hooklying head elevated for remaining there- ex Pelvic tilt posterior with exhale PPT with march x 15  PPT with SLR x 15  PPT with clam , green band x 15  Mini bridge green band x 15  Seated horizontal abduction green band x 15  Standing row and extension blue band x 15   [facility] Adult PT Treatment:                                                DATE: 04/16/22 Therapeutic Exercise: Seated Pilates ring press with core activation  x 10  Seated upper trunk rotation x 5 each side  Seated pelvic tilt x 10  Seated marching holding Pilates ring x 10 Sit to stand with ring x 10  Standing core press with ball , exhale with pushing bilateral UEs  L stretch at bar  Standing springboard row and extension x 15 with core/exhale  Sidefacing Palloff press and rotation  x 15 each with [place]  Supine oblique ball press x 5 each side  Supine bridging with ball  x 15  Heel slides with ball x 15  Lower trunk rotation x 10 small ROM  HEP review stabilization x 10 each : march, heel slide and clam with green band      [facility] Adult PT Treatment:                                                DATE: 04/06/2022 Therapeutic Exercise: Nustep level 5 x 5 mins Pball roll outs forward/lateral x10 each Supine marching 2x10 BIL Supine alternating clamshells 2x10 BIL Bridges 2x10 PPT 5" hold 2x10 Supine hip adduction ball squeeze 5" hold 2x10 LTR x10 BIL STS 2x10 arms crossed   04/03/2022: 716 feet, stopped 2 x for rest 6 min walk  HEP for lumbar stability, core  Pt ed regarding  XR results, anterolisthesis      PATIENT EDUCATION:  Education details: Anatomy, POC, HEP, core , breathing  Person educated: Patient Education method: Explanation, Demonstration, Verbal cues, and Handouts Education comprehension: verbalized understanding, returned  demonstration, and needs further education     HOME EXERCISE PROGRAM: Access Code: [email or web address] URL: [email or web address] Date: 04/16/2022 Prepared by: Raeford Razor  Exercises - Supine Transversus Abdominis Bracing - Hands on Stomach  - 1 x daily - 7 x weekly - 2 sets - 10 reps - 10 hold - Supine March  - 1 x daily - 7 x weekly - 2 sets - 10 reps - Supine Transversus Abdominis Bracing with Leg Extension  - 1 x daily - 7 x weekly - 2 sets - 10 reps - Supine Transversus Abdominis Bracing with Double Leg Fallout  - 1 x daily - 7 x weekly - 2 sets - 10 reps - Supine Lower Trunk Rotation  - 1 x daily - 7 x weekly - 2 sets - 10 reps - 10 hold - Single Arm Shoulder Extension with Anchored Resistance  - 1 x daily - 7 x weekly - 2 sets - 10 reps - 5 hold - Standing Bilateral Low Shoulder Row with Anchored Resistance  - 1 x daily - 7 x weekly - 2 sets - 10 reps - 5 hold - Supine Shoulder Horizontal Abduction with Resistance  - 1 x daily - 7 x weekly - 2 sets - 10 reps - 5 hold   ASSESSMENT:   CLINICAL IMPRESSION: Patient with increase in pain with 2 laps ( approx. 300 feet) in gym up to 7/10, needing a seated rest on 3rd round.  Able to demo HEP with min cues.  She is going out of town for 10 days, will resume PT including aquatics after that. Pt able to walk out of clinic with pain back to baseline.    OBJECTIVE IMPAIRMENTS cardiopulmonary status limiting activity, decreased activity tolerance, decreased endurance, decreased mobility, difficulty walking, decreased strength, increased fascial restrictions, improper body mechanics, postural dysfunction, obesity, and pain.    ACTIVITY LIMITATIONS carrying, lifting, standing, squatting, sleeping, and locomotion level   PARTICIPATION LIMITATIONS: cleaning, shopping, community activity, and occupation   [place], Time since onset of injury/illness/exacerbation, and 3+ comorbidities: obesity, HTN, diabetes  are also  affecting patient's functional outcome.    REHAB POTENTIAL: Excellent   CLINICAL DECISION MAKING: Stable/uncomplicated   EVALUATION COMPLEXITY: Low     GOALS: Goals reviewed with patient? Yes   SHORT TERM GOALS: Target date: 05/01/2022  Pt will be able to show I with HEP  Baseline:given on eval  Goal status: met    2.  Pt will be able to report pain in low back with standing , 10 min minimal 4/10 or less  Baseline: 5-7/10 Goal status: ongoing    3.  Pt will take frequent standing breaks at work in order to improve hip and spine mobility.  Baseline: limited  Goal status: ongoing      LONG TERM GOALS: Target date:  05/29/2022    Pt will be able to be able to lift, squat without increased back pain (mod sized items) Baseline:  Goal status: INITIAL   2.  Pt will be able to walk 6 min walk 800 feet with no more than 1 rest break Baseline: 716 2 breaks Goal status: INITIAL   3.  Pt will be able to show I with HEP for long term health and weight mgmt Baseline:  Goal status: INITIAL   4.  Pt will be able to report no pain when driving or riding in a car, going over uneven terrain (speed bump)  Baseline: has to lean forward  Goal status: INITIAL   5.  FOTO score for back with improve to 59% or better to demo improved functional mobility.  Baseline:  Goal status: INITIAL       PLAN: PT FREQUENCY: 2x/week   PT DURATION: 8 weeks   PLANNED INTERVENTIONS: Therapeutic exercises, Therapeutic activity, Neuromuscular re-education, Balance training, Gait training, Patient/Family education, Self Care, Joint mobilization, Aquatic Therapy, Cryotherapy, Moist heat, Manual therapy, and Re-evaluation   PLAN FOR NEXT SESSION:no nustep due to wgt. : sit to stand  standing as tol.  Flexion based core. Aquatics week 2-3     Miarose Lippert, PT 04/23/2022, 10:19 AM  Raeford Razor, PT 04/23/22 10:19 AM Phone: [phone] Fax: [phone]     PHYSICAL THERAPY DISCHARGE  SUMMARY  Visits from Start of Care: 4  Current functional level related to goals / functional outcomes: Unknown    Remaining deficits: unknown   Education / Equipment: HEP    Patient agrees to discharge. Patient goals were not met. Patient is being discharged due to not returning since the last visit.  Raeford Razor, PT 07/09/22 9:26 AM Phone: [phone] Fax: [phone]

## 2022-04-23 ENCOUNTER — Encounter: Payer: Self-pay | Admitting: Physical Therapy

## 2022-04-23 ENCOUNTER — Ambulatory Visit: Payer: BC Managed Care – PPO | Attending: Family Medicine | Admitting: Physical Therapy

## 2022-04-23 DIAGNOSIS — R262 Difficulty in walking, not elsewhere classified: Secondary | ICD-10-CM | POA: Diagnosis not present

## 2022-04-23 DIAGNOSIS — R2689 Other abnormalities of gait and mobility: Secondary | ICD-10-CM | POA: Insufficient documentation

## 2022-04-23 DIAGNOSIS — M5459 Other low back pain: Secondary | ICD-10-CM | POA: Insufficient documentation

## 2022-04-24 ENCOUNTER — Ambulatory Visit: Payer: BC Managed Care – PPO | Admitting: Physical Therapy

## 2022-05-06 ENCOUNTER — Ambulatory Visit: Payer: BC Managed Care – PPO

## 2022-05-07 ENCOUNTER — Other Ambulatory Visit: Payer: Self-pay | Admitting: Sports Medicine

## 2022-05-07 DIAGNOSIS — M25551 Pain in right hip: Secondary | ICD-10-CM

## 2022-05-07 DIAGNOSIS — M545 Low back pain, unspecified: Secondary | ICD-10-CM

## 2022-05-07 DIAGNOSIS — L0591 Pilonidal cyst without abscess: Secondary | ICD-10-CM

## 2022-05-07 NOTE — Telephone Encounter (Signed)
Placed order for CT. Messaged patient letting them know about the CT that was placed to [facility]

## 2022-05-08 ENCOUNTER — Ambulatory Visit: Payer: BC Managed Care – PPO | Admitting: Physical Therapy

## 2022-05-10 ENCOUNTER — Ambulatory Visit: Payer: BC Managed Care – PPO | Admitting: Sports Medicine

## 2022-05-15 DIAGNOSIS — M48061 Spinal stenosis, lumbar region without neurogenic claudication: Secondary | ICD-10-CM | POA: Diagnosis not present

## 2022-05-15 DIAGNOSIS — M4807 Spinal stenosis, lumbosacral region: Secondary | ICD-10-CM | POA: Diagnosis not present

## 2022-05-15 DIAGNOSIS — M47816 Spondylosis without myelopathy or radiculopathy, lumbar region: Secondary | ICD-10-CM | POA: Diagnosis not present

## 2022-05-15 DIAGNOSIS — M5136 Other intervertebral disc degeneration, lumbar region: Secondary | ICD-10-CM | POA: Diagnosis not present

## 2022-05-16 NOTE — Progress Notes (Signed)
Aleen Sells D.[facility] [address] [place] [address] Phone: [phone]   Assessment and Plan:     1. Pilonidal cyst 2. Chronic bilateral low back pain without sciatica 3. Spinal stenosis of lumbar region without neurogenic claudication 4. Herniated lumbar disc without myelopathy  -Chronic with exacerbation, subsequent visit - Continued bilateral low back pain without radicular symptoms of unclear origin.  Patient had CT myelogram which showed degenerative changes in lumbar spine (herniated disc at L3-L4, severe stenosis at L4-L5 and L5-S1) as well as possibly infected sebaceous cyst (central to left lower back cyst with surrounding inflammation) - Patient's lumbar findings could explain her pain, however patient does not have any radiculopathy or myelopathy which I would expect based off of CT findings.  We could consider epidural injection however I would not want to do an epidural injection until more superficial potentially infected sebaceous cyst has been treated and resolved to avoid potentially tracking infection into spinal cord.  It is also possible that patient's potentially infected sebaceous cyst is a primary pain generator. - Because of explanation above, patient agrees to establish care with surgeon to discuss possibility of treatment versus surgical excision of cyst.  Afterwards, if pain remains, could consider epidural injections - Continue HEP and physical therapy  Pertinent previous records reviewed include CT myelogram   Time of visit 31 minutes, which included chart review, physical exam, treatment plan being performed, interpreted, and discussed with patient at today's visit.   Follow Up: After discussion with surgeon.   Subjective:   I, Moenique Starling Manns, am serving as a Neurosurgeon for Doctor Richardean Sale   Chief Complaint: low back pain    HPI:    03/13/2022 Patient is a 41 year old female complaining of low back  pain . Patient states that she slipped and fell at the gas station her foot got caught in between a poll and her right leg did the split , her bottom hit the floor , after a couple of minutes of walking she feels a burning catching sensation in her hips pelvis, if she goes to sneeze she has to lean back if she is leaning forward she gets a sharp pain in the back of her hips , speed bumps when she goes over a bouncy bridge if she isnt leaning back she will get the same sharpe pain sensation across the back of her hips/ pelvis , slip happened in December. No numbness or tingling,  was on ib 3xs a day but was stopped do to sodium in her blood , will take two tylenol a day and that doesn't help the pain .   04/10/2022 Patient states that she is about the same at exertion  walking still has the painful catch    05/17/2022 Patient states that sh is okay , still has pain after walking or standing some time     Relevant Historical Information: Bilateral cyst, DM type II, elevated BMI, hypertension  Additional pertinent review of systems negative.   Current Outpatient Medications:    diltiazem (CARDIZEM CD) 360 MG 24 hr capsule, Take 1 capsule (360 mg total) by mouth daily., Disp: 90 capsule, Rfl: 3   losartan (COZAAR) 100 MG tablet, TAKE 1 TABLET BY MOUTH EVERY DAY, Disp: 60 tablet, Rfl: 3   rosuvastatin (CRESTOR) 10 MG tablet, Take 1 tablet (10 mg total) by mouth daily., Disp: 90 tablet, Rfl: 3   Semaglutide, 1 MG/DOSE, 4 MG/3ML SOPN, Inject 1 mg as  directed once a week., Disp: 3 mL, Rfl: 5   Vitamin D, Ergocalciferol, (DRISDOL) 1.25 MG (50000 UNIT) CAPS capsule, Take 50,000 Units by mouth daily., Disp: , Rfl:    Ibuprofen (ADVIL) 200 MG CAPS, Take 3 capsules by mouth daily. (Patient not taking: Reported on 04/03/2022), Disp: , Rfl:    meloxicam (MOBIC) 15 MG tablet, Take 1 tablet (15 mg total) by mouth daily., Disp: 30 tablet, Rfl: 0   Objective:     Vitals:   05/17/22 1256  Pulse: 89  SpO2: 98%   Weight: (!) 436 lb (197.8 kg)  Height: 5\' 5"  (1.651 m)      Body mass index is 72.55 kg/m.    Physical Exam:    Gen: Appears well, nad, nontoxic and pleasant.  Large body habitus Psych: Alert and oriented, appropriate mood and affect Neuro: sensation intact, strength is 5/5 in upper and lower extremities, muscle tone wnl Skin: no susupicious lesions or rashes   Back - Normal skin, Spine with normal alignment and no deformity.   TTP superior gluteal cleft, bilateral lumbar paraspinal L3-L5 No tenderness to vertebral process palpation.     Straight leg raise negative      Electronically signed by:  Benito Mccreedy D.[facility] 1:24 PM 05/17/22

## 2022-05-17 ENCOUNTER — Ambulatory Visit (INDEPENDENT_AMBULATORY_CARE_PROVIDER_SITE_OTHER): Payer: BC Managed Care – PPO | Admitting: Sports Medicine

## 2022-05-17 VITALS — HR 89 | Ht 65.0 in | Wt >= 6400 oz

## 2022-05-17 DIAGNOSIS — L0591 Pilonidal cyst without abscess: Secondary | ICD-10-CM

## 2022-05-17 DIAGNOSIS — M5126 Other intervertebral disc displacement, lumbar region: Secondary | ICD-10-CM

## 2022-05-17 DIAGNOSIS — M48061 Spinal stenosis, lumbar region without neurogenic claudication: Secondary | ICD-10-CM

## 2022-05-17 DIAGNOSIS — G8929 Other chronic pain: Secondary | ICD-10-CM | POA: Diagnosis not present

## 2022-05-17 DIAGNOSIS — M545 Low back pain, unspecified: Secondary | ICD-10-CM | POA: Diagnosis not present

## 2022-05-17 NOTE — Patient Instructions (Addendum)
Good to see you  Surgical referral  Continue PT  Follow up after discussion with surgeon

## 2022-05-23 ENCOUNTER — Ambulatory Visit (INDEPENDENT_AMBULATORY_CARE_PROVIDER_SITE_OTHER): Payer: BC Managed Care – PPO | Admitting: Surgery

## 2022-05-23 ENCOUNTER — Encounter: Payer: Self-pay | Admitting: Surgery

## 2022-05-23 VITALS — BP 192/101 | HR 75 | Temp 97.0°F | Resp 16 | Ht 65.0 in | Wt >= 6400 oz

## 2022-05-23 DIAGNOSIS — L0591 Pilonidal cyst without abscess: Secondary | ICD-10-CM | POA: Diagnosis not present

## 2022-05-23 NOTE — Progress Notes (Signed)
[facility] History and Physical  Reason for Referral: Pilonidal cyst Referring Physician: Dr. Glennon Mac  Chief Complaint   New Patient (Initial Visit)     Andrea Macias is a 41 y.o. female.  HPI: Patient presents for evaluation of pilonidal cyst.  She underwent a CT of the pelvis with [facility] on 10/25, and a CT scan demonstrated a large well-formed fluid collection, possibly reflecting sebaceous cyst in the right upper buttock subcutaneous fat, measuring 5.2 x 5.9 x 6.4 cm and additionally possibly infected sebaceous cyst in the lower back measuring 4 x 2 x 4.4 cm.  Patient was advised to follow-up with general surgery, as they were concerned that she could not have a joint injection through this area.  She states that she has a known cyst in her left lower abdomen which occasionally will drain.  It most recently started draining last week.  She denies any fevers, chills, or pain associated with the area.  She was also noted to have a mass/cyst on her right buttock about a year ago.  She denies any symptoms with this area.  Denies any history of previously being infected.  She followed with [facility] for this area, at which time she underwent a CT pelvis which demonstrated a complex subcutaneous cystic lesion in the upper gluteal crease, measuring 8.7 x 7.6 x 6.6 cm, favored to reflect complex pilonidal cyst.  She states that she never received any follow-up information from [place] after this encounter.  Her past medical history is significant for hypertension, obstructive sleep apnea, and diabetes on Ozempic.  She has a history of Roux-en-Y bypass in 2010, surgery to the base of her skull for CSF leak, and a right knee surgery.  She denies use of blood thinning medications.  She denies use of tobacco products, alcohol, and illicit drugs.  Past Medical History:  Diagnosis Date   CSF leak from nose 02/17/2019   S/p repair due to meningocoele. See [facility]. [facility] d2020   Hypertension    Obesity    OSA (obstructive sleep apnea) 02/17/2019    Past Surgical History:  Procedure Laterality Date   ENCEPHALOCELE REPAIR  2020   csf leak, ent at [facility] see records   ROUX-EN-Y PROCEDURE      Family History  Problem Relation Age of Onset   Hypertension Mother    Glaucoma Mother    Non-Hodgkin's lymphoma Mother    Hypertension Father    Lung cancer Father    Healthy Sister    Healthy Brother    Healthy Brother    Healthy Sister     Social History   Tobacco Use   Smoking status: Never   Smokeless tobacco: Never  Substance Use Topics   Alcohol use: Yes    Comment: occasional/monthly   Drug use: No    Medications: I have reviewed the patient's current medications. Allergies as of 05/23/2022       Reactions   Penicillins Itching        Medication List        Accurate as of May 23, 2022  3:18 PM. If you have any questions, ask your nurse or doctor.          STOP taking these medications    Advil 200 MG Caps Generic drug: Ibuprofen Stopped by: Solveig Fangman A Nakhia Levitan, DO   meloxicam 15 MG tablet Commonly known as: MOBIC Stopped by: Jordi Kamm A Amiylah Anastos, DO       TAKE these medications  diltiazem 360 MG 24 hr capsule Commonly known as: Cardizem CD Take 1 capsule (360 mg total) by mouth daily.   losartan 100 MG tablet Commonly known as: COZAAR TAKE 1 TABLET BY MOUTH EVERY DAY   rosuvastatin 10 MG tablet Commonly known as: CRESTOR Take 1 tablet (10 mg total) by mouth daily.   Semaglutide (1 MG/DOSE) 4 MG/3ML Sopn Inject 1 mg as directed once a week.   Vitamin D (Ergocalciferol) 1.25 MG (50000 UNIT) Caps capsule Commonly known as: DRISDOL Take 50,000 Units by mouth daily.         ROS:  Pertinent items are noted in HPI.  Blood pressure (!) 192/101, pulse 75, temperature (!) 97 F (36.1 C), temperature source Oral, resp. rate 16, height 5\' 5"  (1.651 m), weight (!) 420 lb (190.5 kg),  SpO2 97 %. Physical Exam Vitals reviewed.  Constitutional:      Appearance: Normal appearance. She is obese.  HENT:     Head: Normocephalic and atraumatic.  Eyes:     Extraocular Movements: Extraocular movements intact.     Pupils: Pupils are equal, round, and reactive to light.  Cardiovascular:     Rate and Rhythm: Normal rate and regular rhythm.  Pulmonary:     Effort: Pulmonary effort is normal.     Breath sounds: Normal breath sounds.  Abdominal:     Comments: Abdomen soft, nondistended, no percussion tenderness, nontender to palpation; no rigidity, guarding, rebound tenderness; small area of erythema in left lower quadrant under pannus, location of previously draining cyst, no evidence of induration or drainage  Genitourinary:    Comments: Large soft and palpable fluid collection on the right gluteal cheek, nontender, no erythema, induration, or drainage; no other areas on back that appear inflamed, indurated, erythematous, or draining; remainder of back is nontender Musculoskeletal:        General: Normal range of motion.     Cervical back: Normal range of motion.  Skin:    General: Skin is warm and dry.  Neurological:     General: No focal deficit present.     Mental Status: She is alert and oriented to person, place, and time.  Psychiatric:        Mood and Affect: Mood normal.        Behavior: Behavior normal.     Results: CT pelvis (06/08/2021): IMPRESSION: Complex subcutaneous cystic lesion centered at the upper gluteal crease which demonstrates internal septations measuring 8.7 x 7.6 x 6.6 cm, which does not appear to extend to the coccyx/sacrum or gluteal musculature. Favored to reflect a complex pilonidal cyst.  CT spine lumbar with IV contrast (05/15/2022): Impression: 1.  Multilevel facet arthropathy and disc bulging most significant at L3-4 causing moderate central canal stenosis. 2.  Severe neuroforaminal narrowing is present at L4-5 and L5-S1. 3.   Incidental: There is a large well-formed fluid collection possibly reflecting sebaceous cyst in the right upper buttock subcutaneous fat measuring 5.2 x 5.9 x 6.4 cm.  There is an additional possibly infected sebaceous cyst with surrounding stranding and subcutaneous edema seen more superiorly in the lower back on the left measuring 4 x 2 x 4.4 cm   Assessment & Plan:  Anjelica Berres is a 41 y.o. female who presents for evaluation of pilonidal cyst.  -Explained the pathophysiology of pilonidal cyst to the patient.  I also explained that we usually will excise the areas because they are causing pain and getting infected and requiring incision and drainage -Given that this area  has never caused her problem and is currently asymptomatic, I recommend against any surgical excision at this time.  Given the size of the area, its location, and her body habitus, she is at risk for having the wound breakdown and having a large wound on her buttock that would be difficult to heal -I also explained that I cannot identify an area higher up in her back that is concerning for an infectious cyst.  Since she is again asymptomatic of anything in this area, no further intervention is necessary -Patient is agreeable with nonoperative management of her pilonidal cyst at this time -I further advised the patient to follow-up with [place] if she changes her mind, and would like surgical intervention, or if this area begins to cause her pain or gets infected -Information provided to the patient regarding pilonidal and sebaceous cysts -Follow up with me as needed  All questions were answered to the satisfaction of the patient.  Graciella Freer, DO [facility] [address] [place], [place] [address] [phone] (office)

## 2022-06-04 ENCOUNTER — Encounter: Payer: Self-pay | Admitting: Sports Medicine

## 2022-06-05 ENCOUNTER — Telehealth: Payer: Self-pay | Admitting: *Deleted

## 2022-06-05 NOTE — Telephone Encounter (Signed)
Received call from patient [phone]~ telephone.   Patient seen on 05/23/2022 with Dx of Pilonidal Cyst. No surgical intervention required at this time.   Patient calling to discuss Dr. Edison Pace recommendations for epidural injection: ( I discussed with patient that because of the cyst location, the risk of soft tissue moving during a procedure where patient would be lying prone, I feel it is too great of a risk that an epidural steroid injection may traverse near or through an infection and potentially introduce that infection to patient's spinal cord.  I do not feel that the potential risk of the injection is worth the potential benefit of decreased back pain at this time.)  Patient states that she would like to be able to have epidural injection and inquired if you could discuss with Dr. Jean Rosenthal.   Please advise.

## 2022-06-07 ENCOUNTER — Telehealth: Payer: Self-pay | Admitting: Sports Medicine

## 2022-06-07 NOTE — Telephone Encounter (Signed)
Attempted to call patient, however call went to voicemail.  Left patient a voicemail that she could call our clinic back to further discuss potential referral.  Patient's general surgeon, Dr. Robyne Peers, reached out to me to further discuss possibility of epidural steroid injections for patient.  She recommended against intervention for patient's cysts that were seen on CT due to them being asymptomatic and patient's risk of delayed or poor healing.  I understand and agree with this assessment, however I remain hesitant to order an epidural steroid injection with the possibility of an infected cyst and epidural injection tracking infection into patient's spinal cord.  Shifting of adipose tissue during procedure and limitations of fluoroscopy would further complicate this procedure.  We can refer patient to neurosurgery versus pain and spine if she would prefer and she can discuss additional options with those providers.

## 2022-06-10 ENCOUNTER — Other Ambulatory Visit: Payer: Self-pay | Admitting: Sports Medicine

## 2022-06-10 DIAGNOSIS — M25552 Pain in left hip: Secondary | ICD-10-CM

## 2022-06-10 DIAGNOSIS — M48061 Spinal stenosis, lumbar region without neurogenic claudication: Secondary | ICD-10-CM

## 2022-06-10 DIAGNOSIS — M25551 Pain in right hip: Secondary | ICD-10-CM

## 2022-06-10 DIAGNOSIS — M5126 Other intervertebral disc displacement, lumbar region: Secondary | ICD-10-CM

## 2022-06-10 DIAGNOSIS — G8929 Other chronic pain: Secondary | ICD-10-CM

## 2022-06-10 DIAGNOSIS — L0591 Pilonidal cyst without abscess: Secondary | ICD-10-CM

## 2022-06-10 DIAGNOSIS — M545 Low back pain, unspecified: Secondary | ICD-10-CM

## 2022-06-10 NOTE — Telephone Encounter (Signed)
Referral for neurosurgery was placed and sent in

## 2022-06-10 NOTE — Telephone Encounter (Signed)
Patient called back and would like to proceed with the referral to neurosurgery.

## 2022-06-26 DIAGNOSIS — Z6841 Body Mass Index (BMI) 40.0 and over, adult: Secondary | ICD-10-CM | POA: Diagnosis not present

## 2022-06-26 DIAGNOSIS — M4310 Spondylolisthesis, site unspecified: Secondary | ICD-10-CM | POA: Diagnosis not present

## 2022-06-28 ENCOUNTER — Encounter: Payer: Self-pay | Admitting: Sports Medicine

## 2022-07-01 ENCOUNTER — Encounter: Payer: Self-pay | Admitting: Family Medicine

## 2022-07-01 ENCOUNTER — Ambulatory Visit (INDEPENDENT_AMBULATORY_CARE_PROVIDER_SITE_OTHER): Payer: BC Managed Care – PPO | Admitting: Family Medicine

## 2022-07-01 VITALS — BP 126/64 | HR 94 | Temp 97.8°F | Ht 65.0 in | Wt >= 6400 oz

## 2022-07-01 DIAGNOSIS — E1159 Type 2 diabetes mellitus with other circulatory complications: Secondary | ICD-10-CM | POA: Diagnosis not present

## 2022-07-01 DIAGNOSIS — E1129 Type 2 diabetes mellitus with other diabetic kidney complication: Secondary | ICD-10-CM

## 2022-07-01 DIAGNOSIS — Z9884 Bariatric surgery status: Secondary | ICD-10-CM

## 2022-07-01 DIAGNOSIS — Z0000 Encounter for general adult medical examination without abnormal findings: Secondary | ICD-10-CM

## 2022-07-01 DIAGNOSIS — Z23 Encounter for immunization: Secondary | ICD-10-CM

## 2022-07-01 DIAGNOSIS — I152 Hypertension secondary to endocrine disorders: Secondary | ICD-10-CM | POA: Diagnosis not present

## 2022-07-01 DIAGNOSIS — G4733 Obstructive sleep apnea (adult) (pediatric): Secondary | ICD-10-CM | POA: Diagnosis not present

## 2022-07-01 DIAGNOSIS — E782 Mixed hyperlipidemia: Secondary | ICD-10-CM

## 2022-07-01 DIAGNOSIS — E119 Type 2 diabetes mellitus without complications: Secondary | ICD-10-CM

## 2022-07-01 DIAGNOSIS — R809 Proteinuria, unspecified: Secondary | ICD-10-CM

## 2022-07-01 DIAGNOSIS — E1169 Type 2 diabetes mellitus with other specified complication: Secondary | ICD-10-CM

## 2022-07-01 LAB — CBC WITH DIFFERENTIAL/PLATELET
Basophils Absolute: 0 10*3/uL (ref 0.0–0.1)
Basophils Relative: 0.5 % (ref 0.0–3.0)
Eosinophils Absolute: 0 10*3/uL (ref 0.0–0.7)
Eosinophils Relative: 0.5 % (ref 0.0–5.0)
HCT: 39.3 % (ref 36.0–46.0)
Hemoglobin: 12.6 g/dL (ref 12.0–15.0)
Lymphocytes Relative: 24 % (ref 12.0–46.0)
Lymphs Abs: 1.6 10*3/uL (ref 0.7–4.0)
MCHC: 32.1 g/dL (ref 30.0–36.0)
MCV: 85.6 fl (ref 78.0–100.0)
Monocytes Absolute: 0.4 10*3/uL (ref 0.1–1.0)
Monocytes Relative: 5.9 % (ref 3.0–12.0)
Neutro Abs: 4.7 10*3/uL (ref 1.4–7.7)
Neutrophils Relative %: 69.1 % (ref 43.0–77.0)
Platelets: 343 10*3/uL (ref 150.0–400.0)
RBC: 4.59 Mil/uL (ref 3.87–5.11)
RDW: 17.2 % — ABNORMAL HIGH (ref 11.5–15.5)
WBC: 6.8 10*3/uL (ref 4.0–10.5)

## 2022-07-01 LAB — VITAMIN D 25 HYDROXY (VIT D DEFICIENCY, FRACTURES): VITD: 50.57 ng/mL (ref 30.00–100.00)

## 2022-07-01 LAB — COMPREHENSIVE METABOLIC PANEL
ALT: 14 U/L (ref 0–35)
AST: 12 U/L (ref 0–37)
Albumin: 4.3 g/dL (ref 3.5–5.2)
Alkaline Phosphatase: 69 U/L (ref 39–117)
BUN: 16 mg/dL (ref 6–23)
CO2: 29 mEq/L (ref 19–32)
Calcium: 9.5 mg/dL (ref 8.4–10.5)
Chloride: 105 mEq/L (ref 96–112)
Creatinine, Ser: 0.89 mg/dL (ref 0.40–1.20)
GFR: 79.93 mL/min (ref >60.00)
Glucose, Bld: 101 mg/dL — ABNORMAL HIGH (ref 70–99)
Potassium: 4.2 mEq/L (ref 3.5–5.1)
Sodium: 144 mEq/L (ref 135–145)
Total Bilirubin: 0.3 mg/dL (ref 0.2–1.2)
Total Protein: 7.7 g/dL (ref 6.0–8.3)

## 2022-07-01 LAB — MICROALBUMIN / CREATININE URINE RATIO
Creatinine,U: 182.7 mg/dL
Microalb Creat Ratio: 17.9 mg/g (ref 0.0–30.0)
Microalb, Ur: 32.7 mg/dL — ABNORMAL HIGH (ref 0.0–1.9)

## 2022-07-01 LAB — HEMOGLOBIN A1C: Hgb A1c MFr Bld: 6.2 % (ref 4.6–6.5)

## 2022-07-01 LAB — B12 AND FOLATE PANEL
Folate: 4.9 ng/mL — ABNORMAL LOW (ref >5.9)
Vitamin B-12: 363 pg/mL (ref 211–911)

## 2022-07-01 MED ORDER — OZEMPIC (2 MG/DOSE) 8 MG/3ML ~~LOC~~ SOPN: 2.0000 mg | PEN_INJECTOR | SUBCUTANEOUS | 11 refills | Status: DC

## 2022-07-01 NOTE — Patient Instructions (Signed)
Please return in 3 months for diabetes follow up   I will release your lab results to you on your MyChart account with further instructions. You may see the results before I do, but when I review them I will send you a message with my report or have my assistant call you if things need to be discussed. Please reply to my message with any questions. Thank you!   If you have any questions or concerns, please don't hesitate to send me a message via MyChart or call the office at [phone]. Thank you for visiting with us today! It's our pleasure caring for you.  

## 2022-07-01 NOTE — Progress Notes (Signed)
Subjective  Chief Complaint  Patient presents with   Annual Exam    Pt here for Annual exam and is currently fasting     HPI: Andrea Macias is a 41 y.o. female who presents to [facility] today for a Female Wellness Visit.  She also has the concerns and/or needs as listed above in the chief complaint. These will be addressed in addition to the Health Maintenance Visit.   Wellness Visit: annual visit with health maintenance review and exam without Pap  HM: screens are current. Doing well overall. Low back pain remains a problem. I reviewed SM notes and general surgeon notes.  Chronic disease management visit and/or acute problem visit: HTN and DM: both remain controlled; pt w/o sxs of hyperglycemia, low blood sugar or cp/sob, palpitations or lightheadedness. Remains on losartan 166m since may and diltiazem 3635mdaily for bp. She is on truicity 55m102meekly for diabetes and has been out x 2 weeks due to short supply at pharmacy. Tolerating well. Weight still trending downward. + microalbumuria now on arb due for recheck. Has eye exam scheduled for February 2024. Eligible for flu vaccine H/o gastric bypass at risk for vitamin deficiencies.  OSA overdue for sleep study.  HLD on crestor 10 and lipids at goal. Tolerating well.  Assessment  1. Annual physical exam   2. Hypertension associated with type 2 diabetes mellitus ([facility] 3. Morbid obesity ([facility] 4. History of Roux-en-Y gastric bypass nov 2010   5. OSA (obstructive sleep apnea)   6. Type 2 diabetes mellitus without complication, without long-term current use of insulin (HCC)   7. Combined hyperlipidemia associated with type 2 diabetes mellitus ([facility] 8. [facility]album[facility] due [facility] type 2 diabetes mellitus ([facility]    Plan  Female Wellness Visit: Age appropriate Health Maintenance and Prevention measures were discussed with patient. Included topics are cancer screening recommendations, ways to keep  healthy (see AVS) including dietary and exercise recommendations, regular eye and dental care, use of seat belts, and avoidance of moderate alcohol use and tobacco use.  BMI: discussed patient's BMI and encouraged positive lifestyle modifications to help get to or maintain a target BMI. HM needs and immunizations were addressed and ordered. See below for orders. See HM and immunization section for updates. Flu shot today; will need prevnar 20 next visit.  Routine labs and screening tests ordered including cmp, cbc and lipids where appropriate. Discussed recommendations regarding Vit D and calcium supplementation (see AVS)  Chronic disease f/u and/or acute problem visit: (deemed necessary to be done in addition to the wellness visit): HTN:  well controlled on cardizem 360 and losartan 100. Recheck renal function and lytes DM with microalbuminuria: recheck urine and A1c today. Will increase ozempic to 2mg44mekly for diabetes and further weight loss. Monitor bp. Eye exam scheduled. Needs prevnar 20 HLD at goal on crestor 10 Osa: will get scheduled for sleep study next year Check vit levels.    Follow up: 3 mo for dm recheck   Orders Placed This Encounter  Procedures   Hemoglobin A1c   CBC with Differential/Platelet   Comprehensive metabolic panel   Microalbumin / creatinine urine ratio   VITAMIN D 25 Hydroxy (Vit-D Deficiency, Fractures)   B12 and Folate Panel   Iron, TIBC and Ferritin Panel   Meds ordered this encounter  Medications   Semaglutide, 2 MG/DOSE, (OZEMPIC, 2 MG/DOSE,) 8 MG/3ML SOPN    Sig: Inject 2  mg into the skin once a week.    Dispense:  3 mL    Refill:  11       Body mass index is 70.09 kg/m. Wt Readings from Last 3 Encounters:  07/01/22 (!) 421 lb 3.2 oz (191.1 kg)  05/23/22 (!) 420 lb (190.5 kg)  05/17/22 (!) 436 lb (197.8 kg)     Patient Active Problem List   Diagnosis Date Noted   Microalbuminuria due to type 2 diabetes mellitus ([place]) 02/25/2022    Chronic bilateral low back pain without sciatica 02/25/2022   Type 2 diabetes mellitus without complication, without long-term current use of insulin ([place]) 11/19/2021   Combined hyperlipidemia associated with type 2 diabetes mellitus ([place]) 11/19/2021   CSF leak from nose 02/17/2019    S/p repair due to meningocoele. [facility]. Resolve d2020    Encephalocele ([place]) 02/17/2019   OSA (obstructive sleep apnea) 02/17/2019   Hypertension associated with type 2 diabetes mellitus ([place]) 12/05/2018   Morbid obesity ([place]) 11/26/2018   History of Roux-en-Y gastric bypass nov 2010 02/22/2011    Surgery date: 05/22/09    Health Maintenance  Topic Date Due   INFLUENZA VACCINE  02/19/2022   COVID-19 Vaccine (2 - 2023-24 season) 07/17/2022 (Originally 03/22/2022)   OPHTHALMOLOGY EXAM  10/09/2022 (Originally 01/06/1991)   HEMOGLOBIN A1C  08/28/2022   Diabetic kidney evaluation - Urine ACR  11/20/2022   FOOT EXAM  11/20/2022   Diabetic kidney evaluation - eGFR measurement  02/26/2023   PAP SMEAR-Modifier  05/04/2024   DTaP/Tdap/Td (2 - Td or Tdap) 05/04/2029   Hepatitis C Screening  Completed   HIV Screening  Completed   HPV VACCINES  Aged Out   Immunization History  Administered Date(s) Administered   Influenza,inj,Quad PF,6+ Mos 05/05/2019, 10/11/2021   Janssen (J&J) SARS-COV-2 Vaccination 10/15/2019   Tdap 05/05/2019   We updated and reviewed the patient's past history in detail and it is documented below. Allergies: Patient  reports current alcohol use. Past Medical History Patient  has a past medical history of CSF leak from nose (02/17/2019), Hypertension, Obesity, and OSA (obstructive sleep apnea) (02/17/2019). Past Surgical History Patient  has a past surgical history that includes Roux-en-y procedure and Encephalocele repair (2020). Social History   Socioeconomic History   Marital status: Single    Spouse name: never married   Number of children: 0   Years of education: Not on  file   Highest education level: Not on file  Occupational History   Occupation: Hotel manager support    Employer: [employer]  Tobacco Use   Smoking status: Never   Smokeless tobacco: Never  Substance and Sexual Activity   Alcohol use: Yes    Comment: occasional/monthly   Drug use: No   Sexual activity: Not Currently    Birth control/protection: Pill    Comment: ocella  Other Topics Concern   Not on file  Social History Narrative   Not on file   Social Determinants of Health   Financial Resource Strain: Not on file  Food Insecurity: Not on file  Transportation Needs: Not on file  Physical Activity: Not on file  Stress: Not on file  Social Connections: Not on file   Family History  Problem Relation Age of Onset   Hypertension Mother    Glaucoma Mother    Non-Hodgkin's lymphoma Mother    Hypertension Father    Lung cancer Father    Healthy Sister    Healthy Brother    Healthy Brother  Healthy Sister     Review of Systems: Constitutional: negative for fever or malaise Ophthalmic: negative for photophobia, double vision or loss of vision Cardiovascular: negative for chest pain, dyspnea on exertion, or new LE swelling Respiratory: negative for SOB or persistent cough Gastrointestinal: negative for abdominal pain, change in bowel habits or melena Genitourinary: negative for dysuria or gross hematuria, no abnormal uterine bleeding or disharge Musculoskeletal: negative for new gait disturbance or muscular weakness Integumentary: negative for new or persistent rashes, no breast lumps Neurological: negative for TIA or stroke symptoms Psychiatric: negative for SI or delusions Allergic/Immunologic: negative for hives  Patient Care Team    Relationship Specialty Notifications Start End  Leamon Arnt, MD PCP - General Family Medicine  02/17/19   Himmelrich, Bryson Ha, RD (Inactive) Dietitian Bariatrics  09/20/11   Crawford Givens, MD Consulting Physician Obstetrics and Gynecology   02/17/19   Glennon Mac, [place] Physician Sports Medicine  07/01/22     Objective  Vitals: BP 126/64   Pulse 94   Temp 97.8 F (36.6 C)   Ht _0  (1.651 m)   Wt (!) 421 lb 3.2 oz (191.1 kg)   SpO2 94%   BMI 70.09 kg/m  General:  Well developed, well nourished, no acute distress  Psych:  Alert and orientedx3,normal mood and affect HEENT:  Normocephalic, atraumatic, non-icteric sclera, PERRL, supple neck without adenopathy, mass or thyromegaly Cardiovascular:  Normal S1, S2, RRR without gallop, rub + soft murmur Respiratory:  Good breath sounds bilaterally, CTAB with normal respiratory effort Gastrointestinal: limited exam. Soft nontender Skin:  Warm, no rashes or suspicious lesions noted Neurologic:    Mental status is normal.Normal gait. No tremor Ext: no edema   Commons side effects, risks, benefits, and alternatives for medications and treatment plan prescribed today were discussed, and the patient expressed understanding of the given instructions. Patient is instructed to call or message via MyChart if he/she has any questions or concerns regarding our treatment plan. No barriers to understanding were identified. We discussed Red Flag symptoms and signs in detail. Patient expressed understanding regarding what to do in case of urgent or emergency type symptoms.  Medication list was reconciled, printed and provided to the patient in AVS. Patient instructions and summary information was reviewed with the patient as documented in the AVS. This note was prepared with assistance of Dragon voice recognition software. Occasional wrong-word or sound-a-like substitutions may have occurred due to the inherent limitations of voice recognition software .

## 2022-07-02 ENCOUNTER — Telehealth: Payer: Self-pay | Admitting: Sports Medicine

## 2022-07-02 LAB — IRON,TIBC AND FERRITIN PANEL
%SAT: 14 % (calc) — ABNORMAL LOW (ref 16–45)
Ferritin: 43 ng/mL (ref 16–232)
Iron: 56 ug/dL (ref 40–190)
TIBC: 390 mcg/dL (calc) (ref 250–450)

## 2022-07-02 NOTE — Telephone Encounter (Signed)
Andrea Macias called and asked if you knew of any providers that would do her injection with a table that would support her weight? She was told that Dr. Jordan Likes would not be able to do the injection due to not having a table that would support her weight .

## 2022-07-02 NOTE — Telephone Encounter (Signed)
Will give Dr. Jordan Likes a couple of days to reach back out to patient , but my next option would be either the surgical center or OR [place]

## 2022-07-04 ENCOUNTER — Encounter: Payer: Self-pay | Admitting: Family Medicine

## 2022-07-11 DIAGNOSIS — M47816 Spondylosis without myelopathy or radiculopathy, lumbar region: Secondary | ICD-10-CM | POA: Diagnosis not present

## 2022-07-26 DIAGNOSIS — M4696 Unspecified inflammatory spondylopathy, lumbar region: Secondary | ICD-10-CM | POA: Diagnosis not present

## 2022-07-26 DIAGNOSIS — M47816 Spondylosis without myelopathy or radiculopathy, lumbar region: Secondary | ICD-10-CM | POA: Diagnosis not present

## 2022-08-12 DIAGNOSIS — Z6841 Body Mass Index (BMI) 40.0 and over, adult: Secondary | ICD-10-CM | POA: Diagnosis not present

## 2022-08-12 DIAGNOSIS — M47816 Spondylosis without myelopathy or radiculopathy, lumbar region: Secondary | ICD-10-CM | POA: Diagnosis not present

## 2022-10-03 ENCOUNTER — Ambulatory Visit: Payer: BC Managed Care – PPO | Admitting: Family Medicine

## 2022-10-03 ENCOUNTER — Encounter: Payer: Self-pay | Admitting: Family Medicine

## 2022-10-03 ENCOUNTER — Ambulatory Visit (INDEPENDENT_AMBULATORY_CARE_PROVIDER_SITE_OTHER): Payer: BC Managed Care – PPO | Admitting: Family Medicine

## 2022-10-03 VITALS — BP 126/64 | HR 82 | Temp 97.7°F | Ht 65.0 in | Wt >= 6400 oz

## 2022-10-03 DIAGNOSIS — Z9884 Bariatric surgery status: Secondary | ICD-10-CM | POA: Diagnosis not present

## 2022-10-03 DIAGNOSIS — E1159 Type 2 diabetes mellitus with other circulatory complications: Secondary | ICD-10-CM

## 2022-10-03 DIAGNOSIS — I152 Hypertension secondary to endocrine disorders: Secondary | ICD-10-CM

## 2022-10-03 DIAGNOSIS — E119 Type 2 diabetes mellitus without complications: Secondary | ICD-10-CM | POA: Diagnosis not present

## 2022-10-03 DIAGNOSIS — E538 Deficiency of other specified B group vitamins: Secondary | ICD-10-CM | POA: Diagnosis not present

## 2022-10-03 LAB — B12 AND FOLATE PANEL: Folate: 19.3 ng/mL (ref >5.9)

## 2022-10-03 LAB — POCT GLYCOSYLATED HEMOGLOBIN (HGB A1C): Hemoglobin A1C: 5.6 % (ref 4.0–5.6)

## 2022-10-03 MED ORDER — LOSARTAN POTASSIUM 100 MG PO TABS: 100.0000 mg | ORAL_TABLET | Freq: Every day | ORAL | 3 refills | Status: DC

## 2022-10-03 NOTE — Progress Notes (Signed)
Subjective  CC:  Chief Complaint  Patient presents with   Hypertension   Diabetes    HPI: Andrea Macias is a 42 y.o. female who presents to the office today for follow up of diabetes and problems listed above in the chief complaint.  Diabetes follow up: Her diabetic control is reported as Improved. Continue on ozempic '2mg'$  weekly and tolerating well. Weight continues to trend down. Has lost almost 40 pounds since initiated 6 months ago.  She denies exertional CP or SOB or symptomatic hypoglycemia. She denies foot sores or paresthesias. Had eye exam in January: normal. [facility] HTN remains well controlled on meds. No cp Obesity w/ h/o rouy nY bypass w/ folate and b12 deficiency: now on supplements x 3 months.  Wt Readings from Last 3 Encounters:  10/03/22 (!) 406 lb 6.4 oz (184.3 kg)  07/01/22 (!) 421 lb 3.2 oz (191.1 kg)  05/23/22 (!) 420 lb (190.5 kg)    BP Readings from Last 3 Encounters:  10/03/22 126/64  07/01/22 126/64  05/23/22 (!) 192/101    Assessment  1. Type 2 diabetes mellitus without complication, without long-term current use of insulin ([place])   2. Folate deficiency   3. Vitamin B12 deficiency   4. History of Roux-en-Y gastric bypass nov 2010   5. Morbid obesity ([place])   6. Hypertension associated with type 2 diabetes mellitus ([place])      Plan  Diabetes is currently very well controlled. Continue same. On arb, statin. Eye exam current HTN is controlled on losartan and cardizem.  Weight loss is successful on ozempic. Continue. Start exericising.  Recheck b12 and folate levels to ensure absorbing properly.   Follow up: Return in about 6 months (around 04/05/2023) for follow up of diabetes and hypertension.. Orders Placed This Encounter  Procedures   B12 and Folate Panel   POCT HgB A1C   Meds ordered this encounter  Medications   losartan (COZAAR) 100 MG tablet    Sig: Take 1 tablet (100 mg total) by mouth daily.    Dispense:  90 tablet     Refill:  3      Immunization History  Administered Date(s) Administered   Influenza,inj,Quad PF,6+ Mos 05/05/2019, 10/11/2021, 07/01/2022   Janssen (J&J) SARS-COV-2 Vaccination 10/15/2019   Tdap 05/05/2019    Diabetes Related Lab Review: Lab Results  Component Value Date   HGBA1C 5.6 10/03/2022   HGBA1C 6.2 07/01/2022   HGBA1C 6.1 (A) 02/25/2022    Lab Results  Component Value Date   MICROALBUR 32.7 (H) 07/01/2022   Lab Results  Component Value Date   CREATININE 0.89 07/01/2022   BUN 16 07/01/2022   NA 144 07/01/2022   K 4.2 07/01/2022   CL 105 07/01/2022   CO2 29 07/01/2022   Lab Results  Component Value Date   CHOL 111 02/25/2022   CHOL 140 10/11/2021   CHOL 155 05/05/2019   Lab Results  Component Value Date   HDL 35.40 (L) 02/25/2022   HDL 39.10 10/11/2021   HDL 41.60 05/05/2019   Lab Results  Component Value Date   LDLCALC 46 02/25/2022   LDLCALC 83 10/11/2021   LDLCALC 92 05/05/2019   Lab Results  Component Value Date   TRIG 148.0 02/25/2022   TRIG 87.0 10/11/2021   TRIG 107.0 05/05/2019   Lab Results  Component Value Date   CHOLHDL 3 02/25/2022   CHOLHDL 4 10/11/2021   CHOLHDL 4 05/05/2019   No results found for: "LDLDIRECT" The  ASCVD Risk score (Arnett DK, et al., 2019) failed to calculate for the following reasons:   The valid total cholesterol range is 130 to 320 mg/dL I have reviewed the PMH, Fam and Soc history. Patient Active Problem List   Diagnosis Date Noted   Folate deficiency 10/03/2022   Vitamin B12 deficiency 10/03/2022   Microalbuminuria due to type 2 diabetes mellitus ([place]) 02/25/2022   Chronic bilateral low back pain without sciatica 02/25/2022   Type 2 diabetes mellitus without complication, without long-term current use of insulin ([place]) 11/19/2021   Combined hyperlipidemia associated with type 2 diabetes mellitus ([place]) 11/19/2021   CSF leak from nose 02/17/2019    S/p repair due to meningocoele. [facility]. Resolve  d2020    Encephalocele ([place]) 02/17/2019   OSA (obstructive sleep apnea) 02/17/2019   Hypertension associated with type 2 diabetes mellitus ([place]) 12/05/2018   Morbid obesity ([place]) 11/26/2018   History of Roux-en-Y gastric bypass nov 2010 02/22/2011    Surgery date: 05/22/09     Social History: Patient  reports that she has never smoked. She has never used smokeless tobacco. She reports current alcohol use. She reports that she does not use drugs.  Review of Systems: Ophthalmic: negative for eye pain, loss of vision or double vision Cardiovascular: negative for chest pain Respiratory: negative for SOB or persistent cough Gastrointestinal: negative for abdominal pain Genitourinary: negative for dysuria or gross hematuria MSK: negative for foot lesions Neurologic: negative for weakness or gait disturbance  Objective  Vitals: BP 126/64   Pulse 82   Temp 97.7 F (36.5 C)   Ht '5\' 5"'$  (1.651 m)   Wt (!) 406 lb 6.4 oz (184.3 kg)   SpO2 98%   BMI 67.63 kg/m  General: well appearing, no acute distress  Psych:  Alert and oriented, normal mood and affect Cardiovascular:  Nl S1 and S2, RRR without murmur, gallop or rub. no edema Foot exam: no erythema, pallor, or cyanosis visible nl proprioception and sensation to monofilament testing bilaterally, +2 distal pulses bilaterally  Diabetic education: ongoing education regarding chronic disease management for diabetes was given today. We continue to reinforce the ABC's of diabetic management: A1c (<7 or 8 dependent upon patient), tight blood pressure control, and cholesterol management with goal LDL < 100 minimally. We discuss diet strategies, exercise recommendations, medication options and possible side effects. At each visit, we review recommended immunizations and preventive care recommendations for diabetics and stress that good diabetic control can prevent other problems. See below for this patient's data.   Commons side effects, risks,  benefits, and alternatives for medications and treatment plan prescribed today were discussed, and the patient expressed understanding of the given instructions. Patient is instructed to call or message via MyChart if he/she has any questions or concerns regarding our treatment plan. No barriers to understanding were identified. We discussed Red Flag symptoms and signs in detail. Patient expressed understanding regarding what to do in case of urgent or emergency type symptoms.  Medication list was reconciled, printed and provided to the patient in AVS. Patient instructions and summary information was reviewed with the patient as documented in the AVS. This note was prepared with assistance of Dragon voice recognition software. Occasional wrong-word or sound-a-like substitutions may have occurred due to the inherent limitations of voice recognition software

## 2022-10-03 NOTE — Patient Instructions (Signed)
Please return in 6 months for recheck diabetes and blood pressure and weight.   I will release your lab results to you on your MyChart account with further instructions. You may see the results before I do, but when I review them I will send you a message with my report or have my assistant call you if things need to be discussed. Please reply to my message with any questions. Thank you!   If you have any questions or concerns, please don't hesitate to send me a message via MyChart or call the office at [phone]. Thank you for visiting with [place] today! It's our pleasure caring for you.   Vitamin B12 Deficiency Vitamin B12 deficiency occurs when the body does not have enough of this important vitamin. The body needs this vitamin: To make red blood cells. To make DNA. This is the genetic material inside cells. To help the nerves work properly so they can carry messages from the brain to the body. Vitamin B12 deficiency can cause health problems, such as not having enough red blood cells in the blood (anemia). This can lead to nerve damage if untreated. What are the causes? This condition may be caused by: Not eating enough foods that contain vitamin B12. Not having enough stomach acid and digestive fluids to properly absorb vitamin B12 from the food that you eat. Having certain diseases that make it hard to absorb vitamin B12. These diseases include Crohn's disease, chronic pancreatitis, and cystic fibrosis. An autoimmune disorder in which the body does not make enough of a protein (intrinsic factor) within the stomach, resulting in not enough absorption of vitamin B12. Having a surgery in which part of the stomach or small intestine is removed. Taking certain medicines that make it hard for the body to absorb vitamin B12. These include: Heartburn medicines, such as antacids and proton pump inhibitors. Some medicines that are used to treat diabetes. What increases the risk? The following  factors may make you more likely to develop a vitamin B12 deficiency: Being an older adult. Eating a vegetarian or vegan diet that does not include any foods that come from animals. Eating a poor diet while you are pregnant. Taking certain medicines. Having alcoholism. What are the signs or symptoms? In some cases, there are no symptoms of this condition. If the condition leads to anemia or nerve damage, various symptoms may occur, such as: Weakness. Tiredness (fatigue). Loss of appetite. Numbness or tingling in your hands and feet. Redness and burning of the tongue. Depression, confusion, or memory problems. Trouble walking. If anemia is severe, symptoms can include: Shortness of breath. Dizziness. Rapid heart rate. How is this diagnosed? This condition may be diagnosed with a blood test to measure the level of vitamin B12 in your blood. You may also have other tests, including: A group of tests that measure certain characteristics of blood cells (complete blood count, CBC). A blood test to measure intrinsic factor. A procedure where a thin tube with a camera on the end is used to look into your stomach or intestines (endoscopy). Other tests may be needed to discover the cause of the deficiency. How is this treated? Treatment for this condition depends on the cause. This condition may be treated by: Changing your eating and drinking habits, such as: Eating more foods that contain vitamin B12. Drinking less alcohol or no alcohol. Getting vitamin B12 injections. Taking vitamin B12 supplements by mouth (orally). Your health care provider will tell you which dose is best for  you. Follow these instructions at home: Eating and drinking  Include foods in your diet that come from animals and contain a lot of vitamin B12. These include: Meats and poultry. This includes beef, pork, chicken, [place], and organ meats, such as liver. Seafood. This includes clams, rainbow trout, salmon, tuna,  and haddock. Eggs. Dairy foods such as milk, yogurt, and cheese. Eat foods that have vitamin B12 added to them (are fortified), such as ready-to-eat breakfast cereals. Check the label on the package to see if a food is fortified. The items listed above may not be a complete list of foods and beverages you can eat and drink. Contact a dietitian for more information. Alcohol use Do not drink alcohol if: Your health care provider tells you not to drink. You are pregnant, may be pregnant, or are planning to become pregnant. If you drink alcohol: Limit how much you have to: 0-1 drink a day for women. 0-2 drinks a day for men. Know how much alcohol is in your drink. In the [place] one drink equals one 12 oz bottle of beer (355 mL), one 5 oz glass of wine (148 mL), or one 1 oz glass of hard liquor (44 mL). General instructions Get vitamin B12 injections if told to by your health care provider. Take supplements only as told by your health care provider. Follow the directions carefully. Keep all follow-up visits. This is important. Contact a health care provider if: Your symptoms come back. Your symptoms get worse or do not improve with treatment. Get help right away: You develop shortness of breath. You have a rapid heart rate. You have chest pain. You become dizzy or you faint. These symptoms may be an emergency. Get help right away. Call 911. Do not wait to see if the symptoms will go away. Do not drive yourself to the hospital. Summary Vitamin B12 deficiency occurs when the body does not have enough of this important vitamin. Common causes include not eating enough foods that contain vitamin B12, not being able to absorb vitamin B12 from the food that you eat, having a surgery in which part of the stomach or small intestine is removed, or taking certain medicines. Eat foods that have vitamin B12 in them. Treatment may include making a change in the way you eat and drink, getting vitamin  B12 injections, or taking vitamin B12 supplements. This information is not intended to replace advice given to you by your health care provider. Make sure you discuss any questions you have with your health care provider. Document Revised: 03/02/2021 Document Reviewed: 03/02/2021 Elsevier Patient Education  [place].

## 2022-10-04 ENCOUNTER — Ambulatory Visit: Payer: BC Managed Care – PPO | Admitting: Family Medicine

## 2022-10-04 NOTE — Progress Notes (Signed)
Can you please see if the B12 level was ordered? It hasn't been resulted.

## 2022-10-27 ENCOUNTER — Other Ambulatory Visit: Payer: Self-pay | Admitting: Family Medicine

## 2022-11-01 DIAGNOSIS — M47816 Spondylosis without myelopathy or radiculopathy, lumbar region: Secondary | ICD-10-CM | POA: Diagnosis not present

## 2023-01-02 DIAGNOSIS — M47816 Spondylosis without myelopathy or radiculopathy, lumbar region: Secondary | ICD-10-CM | POA: Diagnosis not present

## 2023-01-02 DIAGNOSIS — Z6841 Body Mass Index (BMI) 40.0 and over, adult: Secondary | ICD-10-CM | POA: Diagnosis not present

## 2023-02-07 DIAGNOSIS — M47816 Spondylosis without myelopathy or radiculopathy, lumbar region: Secondary | ICD-10-CM | POA: Diagnosis not present

## 2023-02-18 ENCOUNTER — Encounter: Payer: Self-pay | Admitting: Family Medicine

## 2023-02-19 ENCOUNTER — Other Ambulatory Visit (HOSPITAL_COMMUNITY): Payer: Self-pay

## 2023-02-19 ENCOUNTER — Telehealth: Payer: Self-pay

## 2023-02-19 NOTE — Telephone Encounter (Signed)
Pharmacy Patient Advocate Encounter   Received notification from CoverMyMeds that prior authorization for Ozempic 8mg /20ml is required/requested.   Insurance verification completed.   The patient is insured through [employer] .   Per test claim: PA required; PA submitted to [employer] via CoverMyMeds Key/confirmation #/EOC [id number] Status is pending

## 2023-02-19 NOTE — Telephone Encounter (Signed)
Pharmacy Patient Advocate Encounter  Received notification from [employer] that Prior Authorization for Ozempic 8mg /89ml has been APPROVED from 01/20/23 to 02/19/24. Ran test claim, Copay is $927.37 (at [facility]). Pharmacy is not in 90 day network and 90 day supply is required.   PA #/Case ID/Reference #: [id number]

## 2023-03-06 ENCOUNTER — Encounter (INDEPENDENT_AMBULATORY_CARE_PROVIDER_SITE_OTHER): Payer: Self-pay

## 2023-04-07 ENCOUNTER — Ambulatory Visit: Payer: BC Managed Care – PPO | Admitting: Family Medicine

## 2023-05-07 ENCOUNTER — Ambulatory Visit: Payer: BC Managed Care – PPO | Admitting: Family Medicine

## 2023-05-14 ENCOUNTER — Ambulatory Visit: Payer: BC Managed Care – PPO | Admitting: Family Medicine

## 2023-05-14 ENCOUNTER — Encounter: Payer: Self-pay | Admitting: Family Medicine

## 2023-05-14 VITALS — BP 138/60 | HR 84 | Temp 97.9°F | Ht 65.0 in | Wt 390.1 lb

## 2023-05-14 DIAGNOSIS — Z6841 Body Mass Index (BMI) 40.0 and over, adult: Secondary | ICD-10-CM

## 2023-05-14 DIAGNOSIS — E119 Type 2 diabetes mellitus without complications: Secondary | ICD-10-CM

## 2023-05-14 DIAGNOSIS — R809 Proteinuria, unspecified: Secondary | ICD-10-CM

## 2023-05-14 DIAGNOSIS — E782 Mixed hyperlipidemia: Secondary | ICD-10-CM

## 2023-05-14 DIAGNOSIS — E1129 Type 2 diabetes mellitus with other diabetic kidney complication: Secondary | ICD-10-CM

## 2023-05-14 DIAGNOSIS — I152 Hypertension secondary to endocrine disorders: Secondary | ICD-10-CM | POA: Diagnosis not present

## 2023-05-14 DIAGNOSIS — Z7985 Long-term (current) use of injectable non-insulin antidiabetic drugs: Secondary | ICD-10-CM | POA: Diagnosis not present

## 2023-05-14 DIAGNOSIS — Z1231 Encounter for screening mammogram for malignant neoplasm of breast: Secondary | ICD-10-CM

## 2023-05-14 DIAGNOSIS — E1169 Type 2 diabetes mellitus with other specified complication: Secondary | ICD-10-CM | POA: Diagnosis not present

## 2023-05-14 DIAGNOSIS — Z23 Encounter for immunization: Secondary | ICD-10-CM | POA: Diagnosis not present

## 2023-05-14 DIAGNOSIS — E1159 Type 2 diabetes mellitus with other circulatory complications: Secondary | ICD-10-CM

## 2023-05-14 DIAGNOSIS — G8929 Other chronic pain: Secondary | ICD-10-CM

## 2023-05-14 LAB — CBC WITH DIFFERENTIAL/PLATELET
Basophils Absolute: 0 10*3/uL (ref 0.0–0.1)
Basophils Relative: 0.4 % (ref 0.0–3.0)
Eosinophils Absolute: 0 10*3/uL (ref 0.0–0.7)
Eosinophils Relative: 0.5 % (ref 0.0–5.0)
HCT: 36.5 % (ref 36.0–46.0)
Hemoglobin: 11.6 g/dL — ABNORMAL LOW (ref 12.0–15.0)
Lymphocytes Relative: 22.1 % (ref 12.0–46.0)
Lymphs Abs: 1.9 10*3/uL (ref 0.7–4.0)
MCHC: 31.6 g/dL (ref 30.0–36.0)
MCV: 86 fL (ref 78.0–100.0)
Monocytes Absolute: 0.7 10*3/uL (ref 0.1–1.0)
Monocytes Relative: 7.9 % (ref 3.0–12.0)
Neutro Abs: 6 10*3/uL (ref 1.4–7.7)
Neutrophils Relative %: 69.1 % (ref 43.0–77.0)
Platelets: 384 10*3/uL (ref 150.0–400.0)
RBC: 4.24 Mil/uL (ref 3.87–5.11)
RDW: 16.1 % — ABNORMAL HIGH (ref 11.5–15.5)
WBC: 8.7 10*3/uL (ref 4.0–10.5)

## 2023-05-14 LAB — POCT GLYCOSYLATED HEMOGLOBIN (HGB A1C): Hemoglobin A1C: 5.5 % (ref 4.0–5.6)

## 2023-05-14 LAB — COMPREHENSIVE METABOLIC PANEL
ALT: 12 U/L (ref 0–35)
AST: 12 U/L (ref 0–37)
Albumin: 4 g/dL (ref 3.5–5.2)
Alkaline Phosphatase: 51 U/L (ref 39–117)
BUN: 13 mg/dL (ref 6–23)
CO2: 26 meq/L (ref 19–32)
Calcium: 9 mg/dL (ref 8.4–10.5)
Chloride: 105 meq/L (ref 96–112)
Creatinine, Ser: 0.87 mg/dL (ref 0.40–1.20)
GFR: 81.64 mL/min (ref >60.00)
Glucose, Bld: 91 mg/dL (ref 70–99)
Potassium: 3.8 meq/L (ref 3.5–5.1)
Sodium: 139 meq/L (ref 135–145)
Total Bilirubin: 0.2 mg/dL (ref 0.2–1.2)
Total Protein: 7.5 g/dL (ref 6.0–8.3)

## 2023-05-14 LAB — LIPID PANEL
Cholesterol: 103 mg/dL (ref 0–200)
HDL: 43.9 mg/dL (ref >39.00)
LDL Cholesterol: 46 mg/dL (ref 0–99)
NonHDL: 59.15
Total CHOL/HDL Ratio: 2
Triglycerides: 65 mg/dL (ref 0.0–149.0)
VLDL: 13 mg/dL (ref 0.0–40.0)

## 2023-05-14 LAB — MICROALBUMIN / CREATININE URINE RATIO
Creatinine,U: 137.7 mg/dL
Microalb Creat Ratio: 7.2 mg/g (ref 0.0–30.0)
Microalb, Ur: 9.9 mg/dL — ABNORMAL HIGH (ref 0.0–1.9)

## 2023-05-14 MED ORDER — LOSARTAN POTASSIUM-HCTZ 100-12.5 MG PO TABS: 1.0000 | ORAL_TABLET | Freq: Every day | ORAL | 3 refills | Status: DC

## 2023-05-14 MED ORDER — OZEMPIC (2 MG/DOSE) 8 MG/3ML ~~LOC~~ SOPN: 2.0000 mg | PEN_INJECTOR | SUBCUTANEOUS | 3 refills | Status: DC

## 2023-05-14 NOTE — Patient Instructions (Signed)
Please return in 3 months for your annual complete physical. And recheck diabetes and blood pressure.   I will release your lab results to you on your MyChart account with further instructions. You may see the results before I do, but when I review them I will send you a message with my report or have my assistant call you if things need to be discussed. Please reply to my message with any questions. Thank you!   If you have any questions or concerns, please don't hesitate to send me a message via MyChart or call the office at [phone]. Thank you for visiting with [place] today! It's our pleasure caring for you.   Please call the office checked below to schedule your appointment for your mammogram and/or bone density screen (the checked studies were ordered): [x]   Mammogram  []   Bone Density  [x]   [facility]     [address] [place], [place]        [phone]         []   [facility]  [address] [place], [place]  [phone]

## 2023-05-14 NOTE — Progress Notes (Signed)
See mychart note Dear Ms. Rothschild, Your lab results look great overall. Your sugars, kidney,liver, and cholesterol levels are great. Your urine test has improved.  You have a very mild anemia, likely due to menstrual cycles, so take an otc iron supplement and vitamin B12.   Take care. Sincerely, Dr. Mardelle Matte

## 2023-05-14 NOTE — Progress Notes (Signed)
Subjective  CC:  Chief Complaint  Patient presents with   Hypertension   Diabetes    HPI: Andrea Macias is a 42 y.o. female who presents to the office today for follow up of diabetes and problems listed above in the chief complaint.  Diabetes follow up: Her diabetic control is reported as Improved.  Continues on Ozempic 2 mg weekly.  Weight continues to trend down.  Eating well.  She denies exertional CP or SOB or symptomatic hypoglycemia. She denies foot sores or paresthesias.  Eye exam current.  Eligible for flu shot today.  History of microalbuminuria Hypertension: Fairly good control.  Compliant with Cardizem and losartan.  No chest pain or shortness of breath.  No adverse effects from medications. Hyperlipidemia on Crestor 20.  Nonfasting for recheck today.  Well-tolerated. Obesity: Continues to work on diet.  Ozempic has helped her lose weight.  She is down more than 50 pounds in the last 18 months.   Low back pain after a fall being treated by orthopedics.  Someone limiting, limits walking and exercise.  Wt Readings from Last 3 Encounters:  05/14/23 (!) 390 lb 1.6 oz (176.9 kg)  10/03/22 (!) 406 lb 6.4 oz (184.3 kg)  07/01/22 (!) 421 lb 3.2 oz (191.1 kg)    BP Readings from Last 3 Encounters:  05/14/23 138/60  10/03/22 126/64  07/01/22 126/64    Assessment  1. Type 2 diabetes mellitus without complication, without long-term current use of insulin (HCC)   2. Need for influenza vaccination   3. Hypertension associated with type 2 diabetes mellitus (HCC)   4. Morbid obesity (HCC)   5. Combined hyperlipidemia associated with type 2 diabetes mellitus (HCC)   6. Microalbuminuria due to type 2 diabetes mellitus (HCC)   7. Screening mammogram for breast cancer   8. Chronic bilateral low back pain without sciatica      Plan  Diabetes is currently very well controlled.  Continue Ozempic 2 mg weekly and diabetic diet.  Flu shot updated today.  Recheck urine nephropathy  screen.  On ARB.  Monitor renal function.  Can consider SGLT2 inhibitor if needed Hypertension: Marginal control.  Add HCTZ 12.5 mg daily to losartan.  Continue Cardizem.  Check renal function Recheck lipids and LFTs on Crestor 20 Screening mammogram discussed and ordered. Flu shot updated  Follow up: 3 months for complete physical and recheck blood pressure Orders Placed This Encounter  Procedures   MM DIGITAL SCREENING BILATERAL   Flu vaccine trivalent PF, 6mos and older(Flulaval,Afluria,Fluarix,Fluzone)   Comprehensive metabolic panel   Lipid panel   CBC with Differential/Platelet   Microalbumin / creatinine urine ratio   POCT HgB A1C   Meds ordered this encounter  Medications   losartan-hydrochlorothiazide (HYZAAR) 100-12.5 MG tablet    Sig: Take 1 tablet by mouth daily.    Dispense:  90 tablet    Refill:  3   Semaglutide, 2 MG/DOSE, (OZEMPIC, 2 MG/DOSE,) 8 MG/3ML SOPN    Sig: Inject 2 mg into the skin once a week.    Dispense:  9 mL    Refill:  3      Immunization History  Administered Date(s) Administered   Influenza, Seasonal, Injecte, Preservative Fre 05/14/2023   Influenza,inj,Quad PF,6+ Mos 05/05/2019, 10/11/2021, 07/01/2022   Janssen (J&J) SARS-COV-2 Vaccination 10/15/2019   Tdap 05/05/2019    Diabetes Related Lab Review: Lab Results  Component Value Date   HGBA1C 5.5 05/14/2023   HGBA1C 5.6 10/03/2022   HGBA1C 6.2 07/01/2022  Lab Results  Component Value Date   MICROALBUR 32.7 (H) 07/01/2022   Lab Results  Component Value Date   CREATININE 0.89 07/01/2022   BUN 16 07/01/2022   NA 144 07/01/2022   K 4.2 07/01/2022   CL 105 07/01/2022   CO2 29 07/01/2022   Lab Results  Component Value Date   CHOL 111 02/25/2022   CHOL 140 10/11/2021   CHOL 155 05/05/2019   Lab Results  Component Value Date   HDL 35.40 (L) 02/25/2022   HDL 39.10 10/11/2021   HDL 41.60 05/05/2019   Lab Results  Component Value Date   LDLCALC 46 02/25/2022   LDLCALC 83  10/11/2021   LDLCALC 92 05/05/2019   Lab Results  Component Value Date   TRIG 148.0 02/25/2022   TRIG 87.0 10/11/2021   TRIG 107.0 05/05/2019   Lab Results  Component Value Date   CHOLHDL 3 02/25/2022   CHOLHDL 4 10/11/2021   CHOLHDL 4 05/05/2019   No results found for: "LDLDIRECT" The ASCVD Risk score (Arnett DK, et al., 2019) failed to calculate for the following reasons:   The valid total cholesterol range is 130 to 320 mg/dL I have reviewed the PMH, Fam and Soc history. Patient Active Problem List   Diagnosis Date Noted Date Diagnosed   Microalbuminuria due to type 2 diabetes mellitus (HCC) 02/25/2022     Priority: High   Type 2 diabetes mellitus without complication, without long-term current use of insulin (HCC) 11/19/2021     Priority: High   Combined hyperlipidemia associated with type 2 diabetes mellitus (HCC) 11/19/2021     Priority: High   CSF leak from nose 02/17/2019     Priority: High    S/p repair due to meningocoele. [facility]. Resolve d2020    Encephalocele (HCC) 02/17/2019     Priority: High   OSA (obstructive sleep apnea) 02/17/2019     Priority: High   Hypertension associated with type 2 diabetes mellitus (HCC) 12/05/2018     Priority: High   Morbid obesity (HCC) 11/26/2018     Priority: High   History of Roux-en-Y gastric bypass nov 2010 02/22/2011     Priority: High    Surgery date: 05/22/09    Folate deficiency 10/03/2022     Priority: Medium    Chronic bilateral low back pain without sciatica 02/25/2022     Priority: Medium    Vitamin B12 deficiency 10/03/2022     Priority: Low    Social History: Patient  reports that she has never smoked. She has never used smokeless tobacco. She reports current alcohol use. She reports that she does not use drugs.  Review of Systems: Ophthalmic: negative for eye pain, loss of vision or double vision Cardiovascular: negative for chest pain Respiratory: negative for SOB or persistent  cough Gastrointestinal: negative for abdominal pain Genitourinary: negative for dysuria or gross hematuria MSK: negative for foot lesions Neurologic: negative for weakness or gait disturbance  Objective  Vitals: BP 138/60   Pulse 84   Temp 97.9 F (36.6 C)   Ht 5\' 5"  (1.651 m)   Wt (!) 390 lb 1.6 oz (176.9 kg)   SpO2 97%   BMI 64.92 kg/m  General: well appearing, no acute distress  Psych:  Alert and oriented, normal mood and affect HEENT:  Normocephalic, atraumatic, moist mucous membranes, supple neck  Cardiovascular:  Nl S1 and S2, RRR without murmur, gallop or rub. no edema Respiratory:  Good breath sounds bilaterally, CTAB with normal  effort, no rales Extremities without edema Diabetic education: ongoing education regarding chronic disease management for diabetes was given today. We continue to reinforce the ABC's of diabetic management: A1c (<7 or 8 dependent upon patient), tight blood pressure control, and cholesterol management with goal LDL < 100 minimally. We discuss diet strategies, exercise recommendations, medication options and possible side effects. At each visit, we review recommended immunizations and preventive care recommendations for diabetics and stress that good diabetic control can prevent other problems. See below for this patient's data.   Commons side effects, risks, benefits, and alternatives for medications and treatment plan prescribed today were discussed, and the patient expressed understanding of the given instructions. Patient is instructed to call or message via MyChart if he/she has any questions or concerns regarding our treatment plan. No barriers to understanding were identified. We discussed Red Flag symptoms and signs in detail. Patient expressed understanding regarding what to do in case of urgent or emergency type symptoms.  Medication list was reconciled, printed and provided to the patient in AVS. Patient instructions and summary information was  reviewed with the patient as documented in the AVS. This note was prepared with assistance of Dragon voice recognition software. Occasional wrong-word or sound-a-like substitutions may have occurred due to the inherent limitations of voice recognition software

## 2023-05-15 ENCOUNTER — Ambulatory Visit: Payer: BC Managed Care – PPO | Admitting: Family Medicine

## 2023-05-30 DIAGNOSIS — M47816 Spondylosis without myelopathy or radiculopathy, lumbar region: Secondary | ICD-10-CM | POA: Diagnosis not present

## 2023-06-11 ENCOUNTER — Encounter: Payer: Self-pay | Admitting: Family Medicine

## 2023-06-12 ENCOUNTER — Other Ambulatory Visit: Payer: Self-pay

## 2023-06-12 MED ORDER — DILTIAZEM HCL ER COATED BEADS 360 MG PO CP24: 360.0000 mg | ORAL_CAPSULE | Freq: Every day | ORAL | 2 refills | Status: DC

## 2023-06-16 ENCOUNTER — Other Ambulatory Visit: Payer: Self-pay

## 2023-06-16 MED ORDER — DILTIAZEM HCL ER COATED BEADS 360 MG PO CP24: 360.0000 mg | ORAL_CAPSULE | Freq: Every day | ORAL | 2 refills | Status: DC

## 2023-06-18 ENCOUNTER — Ambulatory Visit
Admission: RE | Admit: 2023-06-18 | Discharge: 2023-06-18 | Disposition: A | Discharge status: Home / Self Care | Payer: BC Managed Care – PPO | Source: Ambulatory Visit | Attending: Family Medicine | Admitting: Family Medicine

## 2023-06-18 DIAGNOSIS — Z1231 Encounter for screening mammogram for malignant neoplasm of breast: Secondary | ICD-10-CM

## 2023-07-11 DIAGNOSIS — M47816 Spondylosis without myelopathy or radiculopathy, lumbar region: Secondary | ICD-10-CM | POA: Diagnosis not present

## 2023-08-13 ENCOUNTER — Encounter: Payer: BC Managed Care – PPO | Admitting: Family Medicine

## 2023-10-06 ENCOUNTER — Telehealth: Payer: Self-pay

## 2023-10-06 ENCOUNTER — Ambulatory Visit (INDEPENDENT_AMBULATORY_CARE_PROVIDER_SITE_OTHER): Payer: BC Managed Care – PPO | Admitting: Family Medicine

## 2023-10-06 ENCOUNTER — Encounter: Payer: Self-pay | Admitting: Family Medicine

## 2023-10-06 VITALS — BP 115/75 | HR 92 | Temp 97.8°F | Ht 65.0 in | Wt 384.6 lb

## 2023-10-06 DIAGNOSIS — E119 Type 2 diabetes mellitus without complications: Secondary | ICD-10-CM

## 2023-10-06 DIAGNOSIS — Z23 Encounter for immunization: Secondary | ICD-10-CM | POA: Diagnosis not present

## 2023-10-06 DIAGNOSIS — Z7985 Long-term (current) use of injectable non-insulin antidiabetic drugs: Secondary | ICD-10-CM | POA: Diagnosis not present

## 2023-10-06 DIAGNOSIS — E1129 Type 2 diabetes mellitus with other diabetic kidney complication: Secondary | ICD-10-CM

## 2023-10-06 DIAGNOSIS — E538 Deficiency of other specified B group vitamins: Secondary | ICD-10-CM

## 2023-10-06 DIAGNOSIS — Z9884 Bariatric surgery status: Secondary | ICD-10-CM | POA: Diagnosis not present

## 2023-10-06 DIAGNOSIS — N920 Excessive and frequent menstruation with regular cycle: Secondary | ICD-10-CM

## 2023-10-06 DIAGNOSIS — R252 Cramp and spasm: Secondary | ICD-10-CM | POA: Diagnosis not present

## 2023-10-06 DIAGNOSIS — I152 Hypertension secondary to endocrine disorders: Secondary | ICD-10-CM

## 2023-10-06 DIAGNOSIS — E1159 Type 2 diabetes mellitus with other circulatory complications: Secondary | ICD-10-CM | POA: Diagnosis not present

## 2023-10-06 DIAGNOSIS — Z3009 Encounter for other general counseling and advice on contraception: Secondary | ICD-10-CM

## 2023-10-06 DIAGNOSIS — Z0001 Encounter for general adult medical examination with abnormal findings: Secondary | ICD-10-CM

## 2023-10-06 DIAGNOSIS — Z6841 Body Mass Index (BMI) 40.0 and over, adult: Secondary | ICD-10-CM

## 2023-10-06 LAB — CBC WITH DIFFERENTIAL/PLATELET
Basophils Absolute: 0 10*3/uL (ref 0.0–0.1)
Basophils Relative: 0.6 % (ref 0.0–3.0)
Eosinophils Absolute: 0 10*3/uL (ref 0.0–0.7)
Eosinophils Relative: 0.7 % (ref 0.0–5.0)
HCT: 36.4 % (ref 36.0–46.0)
Hemoglobin: 12.1 g/dL (ref 12.0–15.0)
Lymphocytes Relative: 27.9 % (ref 12.0–46.0)
Lymphs Abs: 1.8 10*3/uL (ref 0.7–4.0)
MCHC: 33.1 g/dL (ref 30.0–36.0)
MCV: 88.6 fl (ref 78.0–100.0)
Monocytes Absolute: 0.5 10*3/uL (ref 0.1–1.0)
Monocytes Relative: 8.4 % (ref 3.0–12.0)
Neutro Abs: 4 10*3/uL (ref 1.4–7.7)
Neutrophils Relative %: 62.4 % (ref 43.0–77.0)
Platelets: 352 10*3/uL (ref 150.0–400.0)
RBC: 4.11 Mil/uL (ref 3.87–5.11)
RDW: 16.2 % — ABNORMAL HIGH (ref 11.5–15.5)
WBC: 6.4 10*3/uL (ref 4.0–10.5)

## 2023-10-06 LAB — B12 AND FOLATE PANEL
Folate: 20.1 ng/mL (ref >5.9)
Vitamin B-12: 1124 pg/mL — ABNORMAL HIGH (ref 211–911)

## 2023-10-06 LAB — COMPREHENSIVE METABOLIC PANEL
ALT: 13 U/L (ref 0–35)
AST: 14 U/L (ref 0–37)
Albumin: 4.1 g/dL (ref 3.5–5.2)
Alkaline Phosphatase: 46 U/L (ref 39–117)
BUN: 15 mg/dL (ref 6–23)
CO2: 27 meq/L (ref 19–32)
Calcium: 9.2 mg/dL (ref 8.4–10.5)
Chloride: 103 meq/L (ref 96–112)
Creatinine, Ser: 1.22 mg/dL — ABNORMAL HIGH (ref 0.40–1.20)
GFR: 54.26 mL/min — ABNORMAL LOW (ref >60.00)
Glucose, Bld: 85 mg/dL (ref 70–99)
Potassium: 3.4 meq/L — ABNORMAL LOW (ref 3.5–5.1)
Sodium: 140 meq/L (ref 135–145)
Total Bilirubin: 0.2 mg/dL (ref 0.2–1.2)
Total Protein: 7.4 g/dL (ref 6.0–8.3)

## 2023-10-06 LAB — HEMOGLOBIN A1C: Hgb A1c MFr Bld: 5.9 % (ref 4.6–6.5)

## 2023-10-06 LAB — MICROALBUMIN / CREATININE URINE RATIO
Creatinine,U: 182 mg/dL
Microalb Creat Ratio: 33.5 mg/g — ABNORMAL HIGH (ref 0.0–30.0)
Microalb, Ur: 6.1 mg/dL — ABNORMAL HIGH (ref 0.0–1.9)

## 2023-10-06 LAB — VITAMIN D 25 HYDROXY (VIT D DEFICIENCY, FRACTURES): VITD: >120 ng/mL

## 2023-10-06 MED ORDER — ROSUVASTATIN CALCIUM 10 MG PO TABS
10.0000 mg | ORAL_TABLET | Freq: Every day | ORAL | 3 refills | Status: AC
Start: 2023-10-06 — End: ?

## 2023-10-06 NOTE — Addendum Note (Signed)
 Addended by: Samara Deist on: 10/06/2023 09:14 AM   Modules accepted: Orders

## 2023-10-06 NOTE — Telephone Encounter (Signed)
 [facility]  Vit D greater that 120  [facility]

## 2023-10-06 NOTE — Progress Notes (Signed)
 Subjective  Chief Complaint  Patient presents with   Annual Exam    Pt here for annual exam - pt would like to discuss Birth Control options. Pt non-fasting    Hypertension   Diabetes   Leg Injury    Pt c.o of left leg soreness since Tesday morning, not constant, occurs on and off when laying down, pt has tried Tylenol, heating pads that helped some.     HPI: Andrea Macias is a 43 y.o. female who presents to [facility] today for a Female Wellness Visit.  She also has the concerns and/or needs as listed above in the chief complaint. These will be addressed in addition to the Health Maintenance Visit.   Wellness Visit: annual visit with health maintenance review and exam HM: pap smear due by October 2025. Mammo current. Eligible for prevnar 20 due to DM. Doing well overall. Chronic disease management visit and/or acute problem visit: Discussed the use of AI scribe software for clinical note transcription with the patient, who gave verbal consent to proceed.  History of Present Illness   Ronasia Macias is a 43 year old female with hypertension and diabetes who presents with muscle cramps and soreness in the left leg.  She has been experiencing muscle cramps and soreness in her left leg, initially starting in the thigh and progressing to the calf. The sensation is described as a 'charley horse' and has persisted for approximately five days. The soreness is constant but faint, with some relief achieved through the use of a heating pad. She has been performing foot flexing exercises but has not been adequately stretching the muscle. Her water intake has been lower than usual, which she acknowledges may be contributing to her symptoms. She is currently on a diuretic, which could also be affecting her fluid levels.  Diabetes follow-up: Doing very well.  No symptoms of low or high blood sugars.  No neuropathy or foot sores.  She is on Ozempic 2 mg for diabetic and  weight management and has lost ten pounds since her last visit.  She is down 60 pounds total over the last year and a half.  She denies any bothersome side effects from the medication. Her hypertension is reportedly well-controlled with her current medication regimen, which includes an ARB for blood pressure management and kidney protection.  She takes Cardizem and HCTZ as well.  No chest pain or shortness of breath.  No calf edema  Inquires about birth control.  She mentions experiencing heavy menstrual cycles. She has been using condoms for contraception and has not been on birth control for 15-20 years. She is newly sexually active as of a few months ago.     History of gastric bypass with history of folate deficiencies and B12 deficiencies and vitamin D deficiencies.  Due for recheck.  She does take over-the-counter supplements.  No fatigue.  No GERD symptoms.   Assessment  1. Encounter for well adult exam with abnormal findings   2. Hypertension associated with type 2 diabetes mellitus (HCC)   3. Microalbuminuria due to type 2 diabetes mellitus (HCC)   4. Type 2 diabetes mellitus without complication, without long-term current use of insulin (HCC)   5. Morbid obesity (HCC)   6. Long-term (current) use of injectable non-insulin antidiabetic drugs   7. Vitamin B12 deficiency   8. Folate deficiency   9. History of Roux-en-Y gastric bypass nov 2010   10. Menorrhagia with regular cycle   11.  Birth control counseling   12. Muscle cramps      Plan  Female Wellness Visit: Age appropriate Health Maintenance and Prevention measures were discussed with patient. Included topics are cancer screening recommendations, ways to keep healthy (see AVS) including dietary and exercise recommendations, regular eye and dental care, use of seat belts, and avoidance of moderate alcohol use and tobacco use.  BMI: discussed patient's BMI and encouraged positive lifestyle modifications to help get to or maintain a  target BMI. HM needs and immunizations were addressed and ordered. See below for orders. See HM and immunization section for updates. Routine labs and screening tests ordered including cmp, cbc and lipids where appropriate. Discussed recommendations regarding Vit D and calcium supplementation (see AVS)  Chronic disease f/u and/or acute problem visit: (deemed necessary to be done in addition to the wellness visit): Assessment and Plan    Muscle cramps Muscle cramps in the left calf likely due to decreased water intake and diuretic use. - Advise stretching exercises for calf muscles. - Recommend massage and heat application. - Encourage adequate hydration. - Suggest tonic water with quinine. - Monitor and adjust diuretic use if necessary.  Menorrhagia Heavy menstrual bleeding with risk factors including hypertension and diabetes. Discussed birth control options for cycle regulation and contraception. - Refer to gynecology for evaluation and management. -Can update Pap smear as well. - Discuss potential use of Nexplanon or IUD for long-term birth control and cycle regulation.  Type 2 Diabetes Mellitus/hypertension/hyperlipidemia Diabetes management ongoing with improved proteinuria. Monitoring for kidney protection is necessary. - Order blood tests for cholesterol, diabetes, vitamin levels, and iron levels. - Repeat urine test to assess protein levels. - Consider adding SGLT2 inhibitor if proteinuria is present. - Monitor kidney function and adjust medications as needed. -Continue Ozempic -Continue Hyzaar and Cardizem for blood pressure control -Continue statin for hyperlipidemia, check LFTs   Obesity Weight decreased by 10 pounds. Current weight is 384 pounds. On Ozempic 2 mg with no bothersome side effects. - Continue current dose of Ozempic. - Encourage continued weight loss efforts.  General Health Maintenance Routine health maintenance discussed. Pneumonia vaccination is  due. - Administer pneumonia vaccination. - Schedule Pap smear by October 2025.     Follow-up 6 months for recheck diabetes and blood pressure  Orders Placed This Encounter  Procedures   Hemoglobin A1c   CBC with Differential/Platelet   Comprehensive metabolic panel   Microalbumin / creatinine urine ratio   B12 and Folate Panel   Iron, TIBC and Ferritin Panel   VITAMIN D 25 Hydroxy (Vit-D Deficiency, Fractures)   Ambulatory referral to Obstetrics / Gynecology   Meds ordered this encounter  Medications   rosuvastatin (CRESTOR) 10 MG tablet    Sig: Take 1 tablet (10 mg total) by mouth daily.    Dispense:  90 tablet    Refill:  3       Body mass index is 64 kg/m. Wt Readings from Last 3 Encounters:  10/06/23 (!) 384 lb 9.6 oz (174.5 kg)  05/14/23 (!) 390 lb 1.6 oz (176.9 kg)  10/03/22 (!) 406 lb 6.4 oz (184.3 kg)  Down 60 pounds from 2023.   Patient Active Problem List   Diagnosis Date Noted Date Diagnosed   Microalbuminuria due to type 2 diabetes mellitus (HCC) 02/25/2022     Priority: High   Type 2 diabetes mellitus without complication, without long-term current use of insulin (HCC) 11/19/2021     Priority: High   Combined hyperlipidemia associated with type  2 diabetes mellitus (HCC) 11/19/2021     Priority: High   CSF leak from nose 02/17/2019     Priority: High    S/p repair due to meningocoele. [facility]. Resolve d2020    Encephalocele (HCC) 02/17/2019     Priority: High   OSA (obstructive sleep apnea) 02/17/2019     Priority: High   Hypertension associated with type 2 diabetes mellitus (HCC) 12/05/2018     Priority: High   Morbid obesity (HCC) 11/26/2018     Priority: High   History of Roux-en-Y gastric bypass nov 2010 02/22/2011     Priority: High    Surgery date: 05/22/09    Folate deficiency 10/03/2022     Priority: Medium    Chronic bilateral low back pain without sciatica 02/25/2022     Priority: Medium    Vitamin B12 deficiency 10/03/2022      Priority: Low   Health Maintenance  Topic Date Due   COVID-19 Vaccine (2 - 2024-25 season) 10/22/2023 (Originally 03/23/2023)   Pneumococcal Vaccine 36-68 Years old (1 of 2 - PCV) 10/05/2024 (Originally 01/06/1987)   HEMOGLOBIN A1C  11/12/2023   Cervical Cancer Screening (HPV/Pap Cotest)  05/04/2024   Diabetic kidney evaluation - eGFR measurement  05/13/2024   Diabetic kidney evaluation - Urine ACR  05/13/2024   OPHTHALMOLOGY EXAM  07/13/2024   FOOT EXAM  10/05/2024   DTaP/Tdap/Td (2 - Td or Tdap) 05/04/2029   INFLUENZA VACCINE  Completed   Hepatitis C Screening  Completed   HIV Screening  Completed   HPV VACCINES  Aged Out   Immunization History  Administered Date(s) Administered   Influenza, Seasonal, Injecte, Preservative Fre 05/14/2023   Influenza,inj,Quad PF,6+ Mos 05/05/2019, 10/11/2021, 07/01/2022   Janssen (J&J) SARS-COV-2 Vaccination 10/15/2019   Tdap 05/05/2019   We updated and reviewed the patient's past history in detail and it is documented below. Allergies: Patient  reports current alcohol use. Past Medical History Patient  has a past medical history of CSF leak from nose (02/17/2019), Hypertension, Obesity, and OSA (obstructive sleep apnea) (02/17/2019). Past Surgical History Patient  has a past surgical history that includes Roux-en-y procedure and Encephalocele repair (2020). Social History   Socioeconomic History   Marital status: Single    Spouse name: never married   Number of children: 0   Years of education: Not on file   Highest education level: Bachelor's degree (e.g., BA, AB, BS)  Occupational History   Occupation: Scientist, product/process development support    Employer: [employer]  Tobacco Use   Smoking status: Never   Smokeless tobacco: Never  Substance and Sexual Activity   Alcohol use: Yes    Comment: occasional/monthly   Drug use: No   Sexual activity: Not Currently    Birth control/protection: Pill    Comment: ocella  Other Topics Concern   Not on file  Social  History Narrative   Not on file   Social Drivers of Health   Financial Resource Strain: Low Risk  (10/03/2023)   Overall Financial Resource Strain (CARDIA)    Difficulty of Paying Living Expenses: Not hard at all  Food Insecurity: No Food Insecurity (10/03/2023)   Hunger Vital Sign    Worried About Running Out of Food in the Last Year: Never true    Ran Out of Food in the Last Year: Never true  Transportation Needs: No Transportation Needs (10/03/2023)   PRAPARE - Administrator, Civil Service (Medical): No    Lack of Transportation (Non-Medical): No  Physical Activity: Unknown (10/03/2023)   Exercise Vital Sign    Days of Exercise per Week: 0 days    Minutes of Exercise per Session: Not on file  Stress: No Stress Concern Present (10/03/2023)   [employer] of Occupational Health - Occupational Stress Questionnaire    Feeling of Stress : Only a little  Social Connections: Moderately Integrated (10/03/2023)   Social Connection and Isolation Panel [NHANES]    Frequency of Communication with Friends and Family: More than three times a week    Frequency of Social Gatherings with Friends and Family: Once a week    Attends Religious Services: More than 4 times per year    Active Member of Clubs or Organizations: Yes    Attends Engineer, structural: More than 4 times per year    Marital Status: Never married   Family History  Problem Relation Age of Onset   Hypertension Mother    Glaucoma Mother    Non-Hodgkin's lymphoma Mother    Hypertension Father    Lung cancer Father    Healthy Sister    Healthy Brother    Healthy Brother    Healthy Sister     Review of Systems: Constitutional: negative for fever or malaise Ophthalmic: negative for photophobia, double vision or loss of vision Cardiovascular: negative for chest pain, dyspnea on exertion, or new LE swelling Respiratory: negative for SOB or persistent cough Gastrointestinal: negative for abdominal pain,  change in bowel habits or melena Genitourinary: negative for dysuria or gross hematuria, no abnormal uterine bleeding or disharge Musculoskeletal: negative for new gait disturbance or muscular weakness Integumentary: negative for new or persistent rashes, no breast lumps Neurological: negative for TIA or stroke symptoms Psychiatric: negative for SI or delusions Allergic/Immunologic: negative for hives  Patient Care Team    Relationship Specialty Notifications Start End  Willow Ora, MD PCP - General Family Medicine  02/17/19   Himmelrich, Loree Fee, RD (Inactive) Dietitian Bariatrics  09/20/11   Jaymes Graff, MD Consulting Physician Obstetrics and Gynecology  02/17/19   Richardean Sale, DO Consulting Physician Sports Medicine  07/01/22     Objective  Vitals: BP 115/75   Pulse 92   Temp 97.8 F (36.6 C)   Ht 5\' 5"  (1.651 m)   Wt (!) 384 lb 9.6 oz (174.5 kg)   LMP 09/13/2023   SpO2 99%   BMI 64.00 kg/m  General:  Well developed, well nourished, no acute distress  Psych:  Alert and orientedx3,normal mood and affect HEENT:  Normocephalic, atraumatic, non-icteric sclera, PERRL, supple neck without adenopathy, mass or thyromegaly Cardiovascular:  Normal S1, S2, RRR without gallop, rub or murmur Respiratory:  Good breath sounds bilaterally, CTAB with normal respiratory effort Gastrointestinal: normal bowel sounds, soft, non-tender, limited exam due to body habitus  MSK: no deformities, contusions. Joints are without erythema or swelling.  Skin:  Warm, no rashes or suspicious lesions noted Neurologic:    Mental status is normal. Gross motor and sensory exams are normal. Normal gait. No tremor    Commons side effects, risks, benefits, and alternatives for medications and treatment plan prescribed today were discussed, and the patient expressed understanding of the given instructions. Patient is instructed to call or message via MyChart if he/she has any questions or concerns regarding  our treatment plan. No barriers to understanding were identified. We discussed Red Flag symptoms and signs in detail. Patient expressed understanding regarding what to do in case of urgent or emergency type symptoms.  Medication  list was reconciled, printed and provided to the patient in AVS. Patient instructions and summary information was reviewed with the patient as documented in the AVS. This note was prepared with assistance of Dragon voice recognition software. Occasional wrong-word or sound-a-like substitutions may have occurred due to the inherent limitations of voice recognition software .

## 2023-10-06 NOTE — Patient Instructions (Addendum)
 Please return in 3 months to recheck diabetes and blood pressure    I will release your lab results to you on your MyChart account with further instructions. You may see the results before I do, but when I review them I will send you a message with my report or have my assistant call you if things need to be discussed. Please reply to my message with any questions. Thank you!   If you have any questions or concerns, please don't hesitate to send me a message via MyChart or call the office at [phone]. Thank you for visiting with [place] today! It's our pleasure caring for you.   VISIT SUMMARY:  Today, we addressed your muscle cramps, heavy menstrual cycles, diabetes, and general health maintenance. We also discussed your current medications and their effects.  YOUR PLAN:  -MUSCLE CRAMPS: Muscle cramps in your left leg are likely due to low water intake and the use of a diuretic, which can reduce fluid levels. We recommend stretching exercises for your calf muscles, massage, heat application, and drinking more water. You can also try tonic water with quinine. We will monitor and adjust your diuretic use if necessary.  -MENORRHAGIA: Menorrhagia means heavy menstrual bleeding. Given your hypertension and diabetes, we discussed birth control options to help regulate your cycle and provide contraception. You will be referred to a gynecologist for further evaluation and management. Options like Nexplanon or an IUD were discussed for long-term birth control and cycle regulation.  -TYPE 2 DIABETES MELLITUS: Type 2 Diabetes Mellitus is a condition where your body does not use insulin properly, leading to high blood sugar levels. Your diabetes management is ongoing, and we will continue to monitor your kidney function. Blood tests for cholesterol, diabetes, vitamin levels, and iron levels have been ordered. We will repeat a urine test to check protein levels and consider adding a new medication if  needed.  -HYPERTENSION: Hypertension means high blood pressure. Your blood pressure is well-controlled with your current medication regimen, so no changes are needed at this time.  -OBESITY: Obesity is a condition of having excess body weight. You have lost 10 pounds and are currently on Ozempic 2 mg with no bothersome side effects. Continue with your current dose and keep up the good work with your weight loss efforts.  -GENERAL HEALTH MAINTENANCE: Routine health maintenance is important for overall well-being. You are due for a pneumonia vaccination, which we will administer.  INSTRUCTIONS:  Please follow up with the gynecologist for your heavy menstrual cycles, birth control and pap smear which is now due. Continue with the recommended exercises, hydration, and current medications. We will monitor your blood and urine tests and adjust your treatment as needed.

## 2023-10-07 LAB — IRON,TIBC AND FERRITIN PANEL
%SAT: 8 % — ABNORMAL LOW (ref 16–45)
Ferritin: 31 ng/mL (ref 16–232)
Iron: 32 ug/dL — ABNORMAL LOW (ref 40–190)
TIBC: 405 ug/dL (ref 250–450)

## 2023-10-07 NOTE — Telephone Encounter (Signed)
LVM for pt regarding lab results/recommendations  

## 2023-10-15 ENCOUNTER — Encounter: Payer: Self-pay | Admitting: Family Medicine

## 2023-10-15 MED ORDER — LOSARTAN POTASSIUM 100 MG PO TABS: 100.0000 mg | ORAL_TABLET | Freq: Every day | ORAL | 3 refills | Status: DC

## 2023-10-15 MED ORDER — DAPAGLIFLOZIN PROPANEDIOL 5 MG PO TABS: 5.0000 mg | ORAL_TABLET | Freq: Every day | ORAL | 3 refills | Status: DC

## 2023-10-15 NOTE — Progress Notes (Signed)
 See mychart note Dear Ms. Andrea Macias, I have reviewed your lab results.  Your potassium is just a little low and your kidney function is down a little. This could be due being a little dehydrated and from the hydrochlorothiazide; please drink plenty of water . I will recheck at next visit.   To help protect your kidneys further, I'd like to start a new medication called Marcelline Deist: it works at the level of the kidneys and helps control blood sugars, can improve blood pressure and often helps some with weight loss.  Also, I'd like to return to plain losartan 100mg  daily and stop the hydrochlorothiazide.  Please return in 3 months to recheck your blood pressure and ensure you are tolerating the new medications.  Please continue taking iron as your stores are still low but your anemia has resolved.  And please stop your high dose Vit D supplement. You should only take otc vitamin D 1000-2000 units daily.  All these labs need to be rechecked in 3 months so please schedule an appointment.  Sincerely, Dr. Mardelle Matte

## 2023-10-15 NOTE — Addendum Note (Signed)
 Addended by: Asencion Partridge on: 10/15/2023 10:24 AM   Modules accepted: Orders

## 2023-10-16 DIAGNOSIS — M47816 Spondylosis without myelopathy or radiculopathy, lumbar region: Secondary | ICD-10-CM | POA: Diagnosis not present

## 2023-10-17 ENCOUNTER — Other Ambulatory Visit (HOSPITAL_COMMUNITY): Payer: Self-pay

## 2023-10-17 ENCOUNTER — Telehealth: Payer: Self-pay

## 2023-10-17 NOTE — Telephone Encounter (Signed)
 Pharmacy Patient Advocate Encounter  Received notification from [employer] that Prior Authorization for Farxiga 5MG  tablets has been DENIED.  See denial reason below. No denial letter attached in CMM. Will attach denial letter to Media tab once received.   PA #/Case ID/Reference #: [id number]

## 2023-10-17 NOTE — Telephone Encounter (Signed)
 Pharmacy Patient Advocate Encounter   Received notification from [employer] that prior authorization for Farxiga 5MG  tablets is required/requested.   Insurance verification completed.   The patient is insured through [employer] .   Per test claim: PA required; PA submitted to above mentioned insurance via CoverMyMeds Key/confirmation #/EOC [id number] Status is pending

## 2023-10-24 DIAGNOSIS — M47816 Spondylosis without myelopathy or radiculopathy, lumbar region: Secondary | ICD-10-CM | POA: Diagnosis not present

## 2023-11-01 ENCOUNTER — Encounter: Payer: Self-pay | Admitting: Family Medicine

## 2023-11-05 ENCOUNTER — Encounter: Admitting: Obstetrics and Gynecology

## 2023-11-12 ENCOUNTER — Telehealth: Payer: Self-pay | Admitting: Pharmacist

## 2023-11-12 ENCOUNTER — Other Ambulatory Visit (HOSPITAL_COMMUNITY): Payer: Self-pay

## 2023-11-12 NOTE — Telephone Encounter (Signed)
 The appeal for Farxiga  has been approved by the insurance:    Thank you, Dene Fines, PharmD Clinical Pharmacist  [place]  Direct Dial: [phone]

## 2023-11-12 NOTE — Telephone Encounter (Signed)
 An E-Appeal has been submitted for Farxiga . Will advise when response is received.  Appeal letter and supporting documentation were uploaded and submitted via the [facility] website 11/12/2023 @3 :24 pm.  Thank you, Dene Fines, PharmD Clinical Pharmacist  [place]  Direct Dial: [phone]   Also faxed the information to [phone] asking for an expedited review.

## 2023-11-13 NOTE — Telephone Encounter (Signed)
 Noted.

## 2023-11-24 ENCOUNTER — Other Ambulatory Visit (HOSPITAL_COMMUNITY): Payer: Self-pay

## 2023-12-02 ENCOUNTER — Encounter: Admitting: Obstetrics and Gynecology

## 2023-12-29 ENCOUNTER — Encounter: Admitting: Obstetrics and Gynecology

## 2024-01-12 ENCOUNTER — Encounter: Payer: Self-pay | Admitting: Family Medicine

## 2024-01-12 ENCOUNTER — Telehealth: Payer: Self-pay

## 2024-01-12 ENCOUNTER — Ambulatory Visit (INDEPENDENT_AMBULATORY_CARE_PROVIDER_SITE_OTHER): Admitting: Family Medicine

## 2024-01-12 VITALS — BP 169/103 | HR 93 | Temp 97.9°F | Ht 65.0 in | Wt 385.0 lb

## 2024-01-12 DIAGNOSIS — E119 Type 2 diabetes mellitus without complications: Secondary | ICD-10-CM | POA: Diagnosis not present

## 2024-01-12 DIAGNOSIS — E1159 Type 2 diabetes mellitus with other circulatory complications: Secondary | ICD-10-CM

## 2024-01-12 DIAGNOSIS — I152 Hypertension secondary to endocrine disorders: Secondary | ICD-10-CM

## 2024-01-12 DIAGNOSIS — E1129 Type 2 diabetes mellitus with other diabetic kidney complication: Secondary | ICD-10-CM

## 2024-01-12 DIAGNOSIS — E876 Hypokalemia: Secondary | ICD-10-CM | POA: Diagnosis not present

## 2024-01-12 DIAGNOSIS — R809 Proteinuria, unspecified: Secondary | ICD-10-CM

## 2024-01-12 DIAGNOSIS — E559 Vitamin D deficiency, unspecified: Secondary | ICD-10-CM

## 2024-01-12 DIAGNOSIS — Z7985 Long-term (current) use of injectable non-insulin antidiabetic drugs: Secondary | ICD-10-CM | POA: Diagnosis not present

## 2024-01-12 LAB — POCT GLYCOSYLATED HEMOGLOBIN (HGB A1C): Hemoglobin A1C: 5.4 % (ref 4.0–5.6)

## 2024-01-12 LAB — BASIC METABOLIC PANEL WITH GFR
BUN: 12 mg/dL (ref 6–23)
CO2: 26 meq/L (ref 19–32)
Calcium: 9 mg/dL (ref 8.4–10.5)
Chloride: 105 meq/L (ref 96–112)
Creatinine, Ser: 0.89 mg/dL (ref 0.40–1.20)
GFR: 79.08 mL/min (ref >60.00)
Glucose, Bld: 87 mg/dL (ref 70–99)
Potassium: 3.5 meq/L (ref 3.5–5.1)
Sodium: 140 meq/L (ref 135–145)

## 2024-01-12 LAB — VITAMIN D 25 HYDROXY (VIT D DEFICIENCY, FRACTURES): VITD: 102.53 ng/mL (ref 30.00–100.00)

## 2024-01-12 MED ORDER — LOSARTAN POTASSIUM-HCTZ 100-12.5 MG PO TABS
1.0000 | ORAL_TABLET | Freq: Every day | ORAL | 3 refills | Status: DC
Start: 2024-01-12 — End: 2024-05-03

## 2024-01-12 NOTE — Telephone Encounter (Signed)
 Vit d 102.53

## 2024-01-12 NOTE — Patient Instructions (Signed)
 Please return in 3 months to recheck diabetes and blood pressure    I will release your lab results to you on your MyChart account with further instructions. You may see the results before I do, but when I review them I will send you a message with my report or have my assistant call you if things need to be discussed. Please reply to my message with any questions. Thank you!   If you have any questions or concerns, please don't hesitate to send me a message via MyChart or call the office at [phone]. Thank you for visiting with us  today! It's our pleasure caring for you.    VISIT SUMMARY: Today, you were seen for elevated blood pressure. We discussed your current medications and the changes made in March, which may have contributed to your elevated readings. We also reviewed your diabetes management and iron supplementation.  YOUR PLAN: -HYPERTENSION: Hypertension means high blood pressure. Your blood pressure has been elevated since stopping HCTZ due to low potassium levels. We will reintroduce HCTZ at a lower dose along with losartan . Blood work will be done to check your potassium levels and kidney function. Please monitor your blood pressure at home and report any elevated readings. We will follow up in three months.  -DIABETES MELLITUS: Diabetes mellitus is a condition where your blood sugar levels are too high. Your diabetes is well-controlled with an HbA1c of 5.4%, and you are currently taking Farxiga . We will focus on managing your blood pressure before making any changes to your diabetes medication.  -IRON DEFICIENCY ANEMIA: Iron deficiency anemia is a condition where you do not have enough iron in your blood, leading to fatigue and weakness. You are currently taking iron supplements to address this. Please continue taking your iron supplements daily or at least three times a week for the next six months.  -GENERAL HEALTH MAINTENANCE: Your vitamin D  levels were previously elevated, so  we will monitor them. It is important to rest after your night shifts and celebrations to maintain your overall health.  INSTRUCTIONS: Please schedule a follow-up appointment in three months to re-evaluate your blood pressure and overall health. Make sure to get your blood work done to check your potassium levels and kidney function. Continue monitoring your blood pressure at home and report any elevated readings.                      Contains text generated by Abridge.                                 Contains text generated by Abridge.

## 2024-01-12 NOTE — Progress Notes (Signed)
 Subjective  CC:  Chief Complaint  Patient presents with   Hypertension   Diabetes    HPI: Andrea Macias is a 43 y.o. female who presents to the office today for follow up of diabetes and problems listed above in the chief complaint.  Discussed the use of AI scribe software for clinical note transcription with the patient, who gave verbal consent to proceed.  History of Present Illness Andrea Macias is a 43 year old female with hypertension who presents with elevated blood pressure and diabetes.   Her blood pressure has been elevated since her medication regimen was changed in March. She was previously on losartan , HCTZ, and Cardizem . The HCTZ was stopped due to low potassium levels, and a new medication was started to protect her kidneys, Farxiga .  She has not checked her blood pressure since then.  Today blood pressure is much higher than it had been at 160/99.  In March her blood pressure was well-controlled at 115/75.  She has not experienced any significant symptoms such as stress, hyperactivity, or pain that could explain the elevated blood pressure. She has a home blood pressure cuff but has not been using it regularly.  She feels well.  She did work last night.  She has not had sleep yet.  Her diabetes is well-controlled with a current HbA1c of 5.4, and she is on Farxiga  5 and Ozempic  2 mg weekly.  She has had an eye exam in December.  No retinopathy.  No foot sores.  No lows.  Her weight loss has plateaued since March but it is stable.  History of iron deficiency anemia.. She is also taking iron supplements due to a history of anemia, which she will continue as long as she is menstruating.  Her vitamin D  level was elevated.  History of vitamin D  deficiency.  She was taking high-dose supplements.  She is now on 1000 units daily. She celebrated her birthday last night and worked overnight, getting off at 7 AM, and has not slept since. She had caffeine last night but not this  morning.    Wt Readings from Last 3 Encounters:  01/12/24 (!) 385 lb (174.6 kg)  10/06/23 (!) 384 lb 9.6 oz (174.5 kg)  05/14/23 (!) 390 lb 1.6 oz (176.9 kg)    BP Readings from Last 3 Encounters:  01/12/24 (!) 169/103  10/06/23 115/75  05/14/23 138/60    Assessment  1. Type 2 diabetes mellitus without complication, without long-term current use of insulin (HCC)   2. Microalbuminuria due to type 2 diabetes mellitus (HCC)   3. Long-term (current) use of injectable non-insulin antidiabetic drugs   4. Hypertension associated with type 2 diabetes mellitus (HCC)   5. Hypokalemia   6. Vitamin D  deficiency      Plan  Assessment and Plan Assessment & Plan Hypertension, uncontrolled Blood pressure elevated after stopping HCTZ due to hypokalemia. Stress, pain, or sleep deprivation may contribute. No home monitoring. - Reintroduce HCTZ at lower dose with losartan .  Start Hyzaar 100/12.5 daily. - Order blood work for potassium levels and renal function. - Instruct to monitor blood pressure at home and report elevated readings. - Schedule follow-up in three months.  Recheck blood pressure.  Diabetes Mellitus Diabetes well-controlled with HbA1c of 5.4%. Farxiga  effective.  Continue Ozempic .  Focus on hypertension before adjusting Farxiga .  Consider increasing Farxiga  to 10 if blood pressure remains elevated.  Iron Deficiency Anemia On iron supplements to address anemia. Continue to build iron stores due to  menstruation and anemia tendency. - Continue iron supplementation daily or at least three times weekly for six months.  General Health Maintenance Vitamin D  level previously elevated; requires monitoring.  Recheck today.  Rest advised post-night shift and celebration. - Check vitamin D  levels. - Advise rest after night shifts.    Follow up: 3 months for blood pressure and diabetes Orders Placed This Encounter  Procedures   VITAMIN D  25 Hydroxy (Vit-D Deficiency, Fractures)    Basic metabolic panel with GFR   POCT HgB A1C   Meds ordered this encounter  Medications   losartan -hydrochlorothiazide  (HYZAAR) 100-12.5 MG tablet    Sig: Take 1 tablet by mouth daily.    Dispense:  90 tablet    Refill:  3      Immunization History  Administered Date(s) Administered   Influenza, Seasonal, Injecte, Preservative Fre 05/14/2023   Influenza,inj,Quad PF,6+ Mos 05/05/2019, 10/11/2021, 07/01/2022   Janssen (J&J) SARS-COV-2 Vaccination 10/15/2019   PNEUMOCOCCAL CONJUGATE-20 10/06/2023   Tdap 05/05/2019    Diabetes Related Lab Review: Lab Results  Component Value Date   HGBA1C 5.4 01/12/2024   HGBA1C 5.9 10/06/2023   HGBA1C 5.5 05/14/2023    Lab Results  Component Value Date   MICROALBUR 6.1 (H) 10/06/2023   Lab Results  Component Value Date   CREATININE 1.22 (H) 10/06/2023   BUN 15 10/06/2023   NA 140 10/06/2023   K 3.4 (L) 10/06/2023   CL 103 10/06/2023   CO2 27 10/06/2023   Lab Results  Component Value Date   CHOL 103 05/14/2023   CHOL 111 02/25/2022   CHOL 140 10/11/2021   Lab Results  Component Value Date   HDL 43.90 05/14/2023   HDL 35.40 (L) 02/25/2022   HDL 39.10 10/11/2021   Lab Results  Component Value Date   LDLCALC 46 05/14/2023   LDLCALC 46 02/25/2022   LDLCALC 83 10/11/2021   Lab Results  Component Value Date   TRIG 65.0 05/14/2023   TRIG 148.0 02/25/2022   TRIG 87.0 10/11/2021   Lab Results  Component Value Date   CHOLHDL 2 05/14/2023   CHOLHDL 3 02/25/2022   CHOLHDL 4 10/11/2021   No results found for: LDLDIRECT The ASCVD Risk score (Arnett DK, et al., 2019) failed to calculate for the following reasons:   The valid total cholesterol range is 130 to 320 mg/dL I have reviewed the PMH, Fam and Soc history. Patient Active Problem List   Diagnosis Date Noted   Microalbuminuria due to type 2 diabetes mellitus (HCC) 02/25/2022    Priority: High   Type 2 diabetes mellitus with microalbuminuria, without long-term  current use of insulin (HCC) 11/19/2021    Priority: High   Combined hyperlipidemia associated with type 2 diabetes mellitus (HCC) 11/19/2021    Priority: High   CSF leak from nose 02/17/2019    Priority: High    S/p repair due to meningocoele. See [facility] records. Resolve d2020    Encephalocele (HCC) 02/17/2019    Priority: High   OSA (obstructive sleep apnea) 02/17/2019    Priority: High   Hypertension associated with type 2 diabetes mellitus (HCC) 12/05/2018    Priority: High   Morbid obesity (HCC) 11/26/2018    Priority: High   History of Roux-en-Y gastric bypass nov 2010 02/22/2011    Priority: High    Surgery date: 05/22/09    Folate deficiency 10/03/2022    Priority: Medium    Chronic bilateral low back pain without sciatica 02/25/2022    Priority:  Medium    Vitamin B12 deficiency 10/03/2022    Priority: Low    Social History: Patient  reports that she has never smoked. She has never used smokeless tobacco. She reports current alcohol use. She reports that she does not use drugs.  Review of Systems: Ophthalmic: negative for eye pain, loss of vision or double vision Cardiovascular: negative for chest pain Respiratory: negative for SOB or persistent cough Gastrointestinal: negative for abdominal pain Genitourinary: negative for dysuria or gross hematuria MSK: negative for foot lesions Neurologic: negative for weakness or gait disturbance  Objective  Vitals: BP (!) 169/103   Pulse 93   Temp 97.9 F (36.6 C)   Ht 5' 5 (1.651 m)   Wt (!) 385 lb (174.6 kg)   SpO2 98%   BMI 64.07 kg/m  General: well appearing, no acute distress  Psych:  Alert and oriented, normal mood and affect HEENT:  Normocephalic, atraumatic, moist mucous membranes, supple neck  Cardiovascular:  Nl S1 and S2, RRR without murmur, gallop or rub. no edema   Diabetic education: ongoing education regarding chronic disease management for diabetes was given today. We continue to reinforce the ABC's of  diabetic management: A1c (<7 or 8 dependent upon patient), tight blood pressure control, and cholesterol management with goal LDL < 100 minimally. We discuss diet strategies, exercise recommendations, medication options and possible side effects. At each visit, we review recommended immunizations and preventive care recommendations for diabetics and stress that good diabetic control can prevent other problems. See below for this patient's data. Commons side effects, risks, benefits, and alternatives for medications and treatment plan prescribed today were discussed, and the patient expressed understanding of the given instructions. Patient is instructed to call or message via MyChart if he/she has any questions or concerns regarding our treatment plan. No barriers to understanding were identified. We discussed Red Flag symptoms and signs in detail. Patient expressed understanding regarding what to do in case of urgent or emergency type symptoms.  Medication list was reconciled, printed and provided to the patient in AVS. Patient instructions and summary information was reviewed with the patient as documented in the AVS. This note was prepared with assistance of Dragon voice recognition software. Occasional wrong-word or sound-a-like substitutions may have occurred due to the inherent limitations of voice recognition software

## 2024-01-22 DIAGNOSIS — Z124 Encounter for screening for malignant neoplasm of cervix: Secondary | ICD-10-CM | POA: Diagnosis not present

## 2024-01-22 DIAGNOSIS — N6320 Unspecified lump in the left breast, unspecified quadrant: Secondary | ICD-10-CM | POA: Diagnosis not present

## 2024-01-22 DIAGNOSIS — Z01419 Encounter for gynecological examination (general) (routine) without abnormal findings: Secondary | ICD-10-CM | POA: Diagnosis not present

## 2024-01-27 ENCOUNTER — Other Ambulatory Visit: Payer: Self-pay | Admitting: Obstetrics and Gynecology

## 2024-01-27 DIAGNOSIS — N6320 Unspecified lump in the left breast, unspecified quadrant: Secondary | ICD-10-CM

## 2024-02-11 ENCOUNTER — Ambulatory Visit
Admission: RE | Admit: 2024-02-11 | Discharge: 2024-02-11 | Disposition: A | Discharge status: Home / Self Care | Source: Ambulatory Visit | Attending: Obstetrics and Gynecology | Admitting: Obstetrics and Gynecology

## 2024-02-11 DIAGNOSIS — N6320 Unspecified lump in the left breast, unspecified quadrant: Secondary | ICD-10-CM

## 2024-02-11 DIAGNOSIS — N6002 Solitary cyst of left breast: Secondary | ICD-10-CM | POA: Diagnosis not present

## 2024-02-11 LAB — HM MAMMOGRAPHY

## 2024-02-18 DIAGNOSIS — M47816 Spondylosis without myelopathy or radiculopathy, lumbar region: Secondary | ICD-10-CM | POA: Diagnosis not present

## 2024-02-26 DIAGNOSIS — Z30017 Encounter for initial prescription of implantable subdermal contraceptive: Secondary | ICD-10-CM | POA: Diagnosis not present

## 2024-03-08 ENCOUNTER — Other Ambulatory Visit (HOSPITAL_COMMUNITY): Payer: Self-pay

## 2024-03-08 ENCOUNTER — Telehealth: Payer: Self-pay

## 2024-03-08 NOTE — Telephone Encounter (Signed)
 Pharmacy Patient Advocate Encounter   Received notification from CoverMyMeds that prior authorization for Ozempic  (2 MG/DOSE) 8MG /3ML pen-injectors  is required/requested.   Insurance verification completed.   The patient is insured through [employer] .   Per test claim: PA required; PA submitted to above mentioned insurance via Latent Key/confirmation #/EOC [facility] Status is pending

## 2024-03-09 NOTE — Telephone Encounter (Signed)
 Pharmacy Patient Advocate Encounter  Received notification from [employer] that Prior Authorization for Ozempic  (2 MG/DOSE) 8MG /3ML pen-injectors  has been APPROVED from 02/07/2024 to 03/05/2025   PA #/Case ID/Reference #: [id number]

## 2024-03-10 ENCOUNTER — Other Ambulatory Visit: Payer: Self-pay

## 2024-03-10 ENCOUNTER — Encounter: Payer: Self-pay | Admitting: Family Medicine

## 2024-03-10 MED ORDER — OZEMPIC (2 MG/DOSE) 8 MG/3ML ~~LOC~~ SOPN: 2.0000 mg | PEN_INJECTOR | SUBCUTANEOUS | 3 refills | Status: AC

## 2024-03-12 DIAGNOSIS — M1288 Other specific arthropathies, not elsewhere classified, other specified site: Secondary | ICD-10-CM | POA: Diagnosis not present

## 2024-03-12 DIAGNOSIS — M47816 Spondylosis without myelopathy or radiculopathy, lumbar region: Secondary | ICD-10-CM | POA: Diagnosis not present

## 2024-03-22 ENCOUNTER — Other Ambulatory Visit: Payer: Self-pay | Admitting: Family Medicine

## 2024-04-14 ENCOUNTER — Ambulatory Visit: Admitting: Family Medicine

## 2024-04-15 ENCOUNTER — Ambulatory Visit: Admitting: Family Medicine

## 2024-04-23 ENCOUNTER — Ambulatory Visit: Admitting: Family Medicine

## 2024-04-29 ENCOUNTER — Ambulatory Visit: Admitting: Family Medicine

## 2024-04-30 ENCOUNTER — Encounter: Payer: Self-pay | Admitting: Family Medicine

## 2024-04-30 ENCOUNTER — Ambulatory Visit: Admitting: Family Medicine

## 2024-04-30 VITALS — BP 136/80 | HR 82 | Temp 97.7°F | Ht 65.0 in | Wt 376.0 lb

## 2024-04-30 DIAGNOSIS — E119 Type 2 diabetes mellitus without complications: Secondary | ICD-10-CM

## 2024-04-30 DIAGNOSIS — E876 Hypokalemia: Secondary | ICD-10-CM

## 2024-04-30 DIAGNOSIS — E1129 Type 2 diabetes mellitus with other diabetic kidney complication: Secondary | ICD-10-CM | POA: Diagnosis not present

## 2024-04-30 DIAGNOSIS — Z7985 Long-term (current) use of injectable non-insulin antidiabetic drugs: Secondary | ICD-10-CM

## 2024-04-30 DIAGNOSIS — Z23 Encounter for immunization: Secondary | ICD-10-CM

## 2024-04-30 DIAGNOSIS — E559 Vitamin D deficiency, unspecified: Secondary | ICD-10-CM | POA: Diagnosis not present

## 2024-04-30 DIAGNOSIS — R809 Proteinuria, unspecified: Secondary | ICD-10-CM

## 2024-04-30 LAB — POCT GLYCOSYLATED HEMOGLOBIN (HGB A1C): Hemoglobin A1C: 5.5 % (ref 4.0–5.6)

## 2024-04-30 LAB — MAGNESIUM: Magnesium: 2.1 mg/dL (ref 1.5–2.5)

## 2024-04-30 LAB — BASIC METABOLIC PANEL WITH GFR
BUN: 12 mg/dL (ref 6–23)
CO2: 28 meq/L (ref 19–32)
Calcium: 9.4 mg/dL (ref 8.4–10.5)
Chloride: 108 meq/L (ref 96–112)
Creatinine, Ser: 0.84 mg/dL (ref 0.40–1.20)
GFR: 84.58 mL/min (ref >60.00)
Glucose, Bld: 99 mg/dL (ref 70–99)
Potassium: 4.1 meq/L (ref 3.5–5.1)
Sodium: 147 meq/L — ABNORMAL HIGH (ref 135–145)

## 2024-04-30 LAB — VITAMIN D 25 HYDROXY (VIT D DEFICIENCY, FRACTURES): VITD: 85.65 ng/mL (ref 30.00–100.00)

## 2024-04-30 MED ORDER — DAPAGLIFLOZIN PROPANEDIOL 10 MG PO TABS: 10.0000 mg | ORAL_TABLET | Freq: Every day | ORAL | 3 refills | Status: AC

## 2024-04-30 NOTE — Patient Instructions (Signed)
 Please return in 6 months for your annual complete physical; please come fasting.  For follow up on chronic medical conditions   I will release your lab results to you on your MyChart account with further instructions. You may see the results before I do, but when I review them I will send you a message with my report or have my assistant call you if things need to be discussed. Please reply to my message with any questions. Thank you!   If you have any questions or concerns, please don't hesitate to send me a message via MyChart or call the office at [phone]. Thank you for visiting with us  today! It's our pleasure caring for you.    VISIT SUMMARY: Today, we discussed the management of your hypertension, diabetes, and vitamin D  levels. We also reviewed your recent fatigue and infection prevention measures.  YOUR PLAN: -HYPERTENSION: Hypertension, or high blood pressure, is when the force of the blood against your artery walls is too high. Your blood pressure has improved but is not yet at target levels. We will increase your dose of Farxiga  and continue to recommend reducing dietary salt.  -CHRONIC KIDNEY DISEASE: Chronic kidney disease is a condition characterized by a gradual loss of kidney function over time. We will monitor your kidney function with lab tests.  -TYPE 2 DIABETES MELLITUS: Type 2 diabetes is a condition that affects the way your body processes blood sugar. Your diabetes is well-controlled with Ozempic , and you should continue taking it as prescribed.  -HYPOKALEMIA: Hypokalemia is a condition where you have low levels of potassium in your blood. We will recheck your potassium levels with lab tests.  -VITAMIN D  DEFICIENCY: Vitamin D  deficiency occurs when you do not have enough vitamin D  in your body. You previously took too much vitamin D , so we will recheck your levels and restart supplementation at a lower dose after we get the lab results.  -GENERAL HEALTH MAINTENANCE:  We discussed the importance of monitoring your vitamin B12 and folic acid  levels. You are currently taking an iron supplement. We will check your B12 and folic acid  levels with lab tests.  INSTRUCTIONS: Please follow up with the lab tests as ordered to monitor your kidney function, potassium levels, vitamin D  levels, and vitamin B12 and folic acid  levels. Continue taking your medications as prescribed and follow the dietary recommendations. If you have any questions or concerns, please contact our office.                      Contains text generated by Abridge.                                 Contains text generated by Abridge.

## 2024-04-30 NOTE — Progress Notes (Signed)
 Subjective  CC:  Chief Complaint  Patient presents with   Medical Management of Chronic Issues    Pt here for 3 month f/u Diabetes, Hypertension.    HPI: Andrea Macias is a 43 y.o. female who presents to the office today for follow up of diabetes and problems listed above in the chief complaint.  Discussed the use of AI scribe software for clinical note transcription with the patient, who gave verbal consent to proceed.  History of Present Illness Andrea Macias is a 43 year old female who presents for follow-up of hypertension and vitamin D  management.  Hypertension management - Does not regularly monitor blood pressure at home. - BP is improved on losartan  and low dose hydrochlorothiazide  (due to hypokalemia). Due for recheck. Feels well.  - BP remains just above goal.   Diabetes mellitus management - Currently taking Ozempic2mg  weekly and farxiga  5, which is effectively managing diabetes. - Most recent hemoglobin A1c in the 5s. - No issues with Ozempic . - No current breathing difficulties.  Morbid obesity on Ozempic . Weight continues to trend downward  Vitamin d  supplementation - Mistakenly took high-dose vitamin D  supplement daily instead of weekly, resulting in elevated vitamin D  levels. - Has discontinued vitamin D  supplementation. - Not currently taking any vitamin D .   Infection prevention measures - Currently taking an iron pill. - Using emergency vitamin C due to increased exposure to people coughing and sneezing at work.    Wt Readings from Last 3 Encounters:  04/30/24 (!) 376 lb (170.6 kg)  01/12/24 (!) 385 lb (174.6 kg)  10/06/23 (!) 384 lb 9.6 oz (174.5 kg)    BP Readings from Last 3 Encounters:  04/30/24 136/80  01/12/24 (!) 169/103  10/06/23 115/75    Assessment  1. Type 2 diabetes mellitus without complication, without long-term current use of insulin (HCC)   2. Microalbuminuria due to type 2 diabetes mellitus (HCC)   3. Long-term  (current) use of injectable non-insulin antidiabetic drugs   4. Hypokalemia   5. Vitamin D  deficiency   6. Need for immunization against influenza   7. Morbid obesity (HCC)      Plan  Assessment and Plan Assessment & Plan Hypertension Blood pressure is improved but not at target levels.  - will try increasing dose of Farxiga  to see if will help get bp to goal. - continue arb/hydrochlorothiazide  -recheck potassium w/ magnesium - Continue dietary salt reduction  Chronic kidney disease, unspecified stage Chronic kidney disease requires monitoring. - Order lab tests to assess kidney function  Type 2 diabetes mellitus and obesity  improving Diabetes is well-controlled with an A1c in the 5s. Current treatment with Ozempic  is effective. - Continue Ozempic   Hypokalemia - Order lab tests to recheck potassium levels with mag  Vitamin D  deficiency Previous vitamin D  supplementation was excessive due to misunderstanding of dosing instructions, leading to potential toxicity. Currently not taking vitamin D . - Order lab tests to recheck vitamin D  levels - Restart vitamin D  supplementation at a lower dose after lab results  General Health Maintenance Discussed the importance of monitoring vitamin B12 and folic acid  levels. She is currently taking an iron supplement. - Order lab tests to check B12 and folic acid  levels    Follow up: 6 mo for cpe Orders Placed This Encounter  Procedures   Flu vaccine trivalent PF, 6mos and older(Flulaval,Afluria,Fluarix,Fluzone)   Basic metabolic panel with GFR   Magnesium   VITAMIN D  25 Hydroxy (Vit-D Deficiency, Fractures)   POCT glycosylated  hemoglobin (Hb A1C)   Meds ordered this encounter  Medications   dapagliflozin  propanediol (FARXIGA ) 10 MG TABS tablet    Sig: Take 1 tablet (10 mg total) by mouth daily.    Dispense:  90 tablet    Refill:  3      Immunization History  Administered Date(s) Administered   Influenza, Seasonal, Injecte,  Preservative Fre 05/14/2023, 04/30/2024   Influenza,inj,Quad PF,6+ Mos 05/05/2019, 10/11/2021, 07/01/2022   Janssen (J&J) SARS-COV-2 Vaccination 10/15/2019   PNEUMOCOCCAL CONJUGATE-20 10/06/2023   Tdap 05/05/2019    Diabetes Related Lab Review: Lab Results  Component Value Date   HGBA1C 5.5 04/30/2024   HGBA1C 5.4 01/12/2024   HGBA1C 5.9 10/06/2023    Lab Results  Component Value Date   MICROALBUR 6.1 (H) 10/06/2023   Lab Results  Component Value Date   CREATININE 0.89 01/12/2024   BUN 12 01/12/2024   NA 140 01/12/2024   K 3.5 01/12/2024   CL 105 01/12/2024   CO2 26 01/12/2024   Lab Results  Component Value Date   CHOL 103 05/14/2023   CHOL 111 02/25/2022   CHOL 140 10/11/2021   Lab Results  Component Value Date   HDL 43.90 05/14/2023   HDL 35.40 (L) 02/25/2022   HDL 39.10 10/11/2021   Lab Results  Component Value Date   LDLCALC 46 05/14/2023   LDLCALC 46 02/25/2022   LDLCALC 83 10/11/2021   Lab Results  Component Value Date   TRIG 65.0 05/14/2023   TRIG 148.0 02/25/2022   TRIG 87.0 10/11/2021   Lab Results  Component Value Date   CHOLHDL 2 05/14/2023   CHOLHDL 3 02/25/2022   CHOLHDL 4 10/11/2021   No results found for: LDLDIRECT The ASCVD Risk score (Arnett DK, et al., 2019) failed to calculate for the following reasons:   The valid total cholesterol range is 130 to 320 mg/dL I have reviewed the PMH, Fam and Soc history. Patient Active Problem List   Diagnosis Date Noted   Microalbuminuria due to type 2 diabetes mellitus (HCC) 02/25/2022    Priority: High   Type 2 diabetes mellitus with microalbuminuria, without long-term current use of insulin (HCC) 11/19/2021    Priority: High   Combined hyperlipidemia associated with type 2 diabetes mellitus (HCC) 11/19/2021    Priority: High   CSF leak from nose 02/17/2019    Priority: High    S/p repair due to meningocoele. See [facility] records. Resolve d2020    Encephalocele (HCC) 02/17/2019    Priority:  High   OSA (obstructive sleep apnea) 02/17/2019    Priority: High   Hypertension associated with type 2 diabetes mellitus (HCC) 12/05/2018    Priority: High   Morbid obesity (HCC) 11/26/2018    Priority: High   History of Roux-en-Y gastric bypass nov 2010 02/22/2011    Priority: High    Surgery date: 05/22/09    Folate deficiency 10/03/2022    Priority: Medium    Chronic bilateral low back pain without sciatica 02/25/2022    Priority: Medium    Vitamin B12 deficiency 10/03/2022    Priority: Low    Social History: Patient  reports that she has never smoked. She has never used smokeless tobacco. She reports current alcohol use. She reports that she does not use drugs.  Review of Systems: Ophthalmic: negative for eye pain, loss of vision or double vision Cardiovascular: negative for chest pain Respiratory: negative for SOB or persistent cough Gastrointestinal: negative for abdominal pain Genitourinary: negative for dysuria or  gross hematuria MSK: negative for foot lesions Neurologic: negative for weakness or gait disturbance  Objective  Vitals: BP 136/80 (BP Location: Left Arm, Patient Position: Sitting, Cuff Size: Large)   Pulse 82   Temp 97.7 F (36.5 C) (Temporal)   Ht 5' 5 (1.651 m)   Wt (!) 376 lb (170.6 kg)   LMP 04/07/2024 (Exact Date)   SpO2 98%   BMI 62.57 kg/m  General: well appearing, no acute distress  Psych:  Alert and oriented, normal mood and affect HEENT:  Normocephalic, atraumatic, moist mucous membranes, supple neck  Cardiovascular:  Nl S1 and S2, RRR without murmur, gallop or rub. no edema Respiratory:  Good breath sounds bilaterally, CTAB with normal effort, no rales  Diabetic education: ongoing education regarding chronic disease management for diabetes was given today. We continue to reinforce the ABC's of diabetic management: A1c (<7 or 8 dependent upon patient), tight blood pressure control, and cholesterol management with goal LDL < 100 minimally.  We discuss diet strategies, exercise recommendations, medication options and possible side effects. At each visit, we review recommended immunizations and preventive care recommendations for diabetics and stress that good diabetic control can prevent other problems. See below for this patient's data. Commons side effects, risks, benefits, and alternatives for medications and treatment plan prescribed today were discussed, and the patient expressed understanding of the given instructions. Patient is instructed to call or message via MyChart if he/she has any questions or concerns regarding our treatment plan. No barriers to understanding were identified. We discussed Red Flag symptoms and signs in detail. Patient expressed understanding regarding what to do in case of urgent or emergency type symptoms.  Medication list was reconciled, printed and provided to the patient in AVS. Patient instructions and summary information was reviewed with the patient as documented in the AVS. This note was prepared with assistance of Dragon voice recognition software. Occasional wrong-word or sound-a-like substitutions may have occurred due to the inherent limitations of voice recognition software

## 2024-05-02 ENCOUNTER — Ambulatory Visit: Payer: Self-pay | Admitting: Family Medicine

## 2024-05-02 ENCOUNTER — Encounter: Payer: Self-pay | Admitting: Family Medicine

## 2024-05-02 NOTE — Progress Notes (Signed)
 See mychart note Dear Ms. Javan, Your lab results are stable. You may have been a little dehydrated making your sodium level just above normal. Ensure you drink enough water throughout the day. Your Vit D level is now normal. I recommend taking otc vit D 1000 units 2-3x/ week only and we will recheck next visit.   Sincerely, Dr. Jodie

## 2024-05-03 ENCOUNTER — Other Ambulatory Visit: Payer: Self-pay

## 2024-05-03 MED ORDER — LOSARTAN POTASSIUM-HCTZ 100-12.5 MG PO TABS: 1.0000 | ORAL_TABLET | Freq: Every day | ORAL | 3 refills | Status: AC

## 2024-05-05 ENCOUNTER — Other Ambulatory Visit: Payer: Self-pay | Admitting: Obstetrics and Gynecology

## 2024-05-05 DIAGNOSIS — Z1231 Encounter for screening mammogram for malignant neoplasm of breast: Secondary | ICD-10-CM

## 2024-06-04 NOTE — Progress Notes (Signed)
 Andrea Macias                                          MRN: [id number]   06/04/2024   The [facility] Quality Team Specialist reviewed this patient medical record for the purposes of chart review for care gap closure. The following were reviewed: abstraction for care gap closure-kidney health evaluation for diabetes:eGFR  and uACR.    [facility] Quality Team

## 2024-06-30 ENCOUNTER — Ambulatory Visit

## 2024-07-27 ENCOUNTER — Ambulatory Visit
Admission: RE | Admit: 2024-07-27 | Discharge: 2024-07-27 | Disposition: A | Discharge status: Home / Self Care | Source: Ambulatory Visit | Attending: Obstetrics and Gynecology | Admitting: Obstetrics and Gynecology

## 2024-07-27 DIAGNOSIS — Z1231 Encounter for screening mammogram for malignant neoplasm of breast: Secondary | ICD-10-CM

## 2024-10-29 ENCOUNTER — Encounter: Admitting: Family Medicine
